# Patient Record
Sex: Male | Born: 1940 | Race: White | Hispanic: No | Marital: Married | Smoking: Never smoker
Health system: Southern US, Community
[De-identification: ages and names within clinical notes are randomized; demographics above are authoritative.]

## PROBLEM LIST (undated history)

## (undated) DIAGNOSIS — T8859XA Other complications of anesthesia, initial encounter: Secondary | ICD-10-CM

## (undated) DIAGNOSIS — I10 Essential (primary) hypertension: Secondary | ICD-10-CM

## (undated) DIAGNOSIS — E7800 Pure hypercholesterolemia, unspecified: Secondary | ICD-10-CM

## (undated) DIAGNOSIS — Z8619 Personal history of other infectious and parasitic diseases: Secondary | ICD-10-CM

## (undated) DIAGNOSIS — Z952 Presence of prosthetic heart valve: Secondary | ICD-10-CM

## (undated) DIAGNOSIS — T4145XA Adverse effect of unspecified anesthetic, initial encounter: Secondary | ICD-10-CM

## (undated) DIAGNOSIS — Z87898 Personal history of other specified conditions: Secondary | ICD-10-CM

## (undated) DIAGNOSIS — I350 Nonrheumatic aortic (valve) stenosis: Secondary | ICD-10-CM

## (undated) DIAGNOSIS — I2510 Atherosclerotic heart disease of native coronary artery without angina pectoris: Secondary | ICD-10-CM

## (undated) DIAGNOSIS — I739 Peripheral vascular disease, unspecified: Secondary | ICD-10-CM

## (undated) DIAGNOSIS — R011 Cardiac murmur, unspecified: Secondary | ICD-10-CM

## (undated) DIAGNOSIS — I359 Nonrheumatic aortic valve disorder, unspecified: Secondary | ICD-10-CM

## (undated) DIAGNOSIS — R259 Unspecified abnormal involuntary movements: Secondary | ICD-10-CM

## (undated) DIAGNOSIS — B009 Herpesviral infection, unspecified: Secondary | ICD-10-CM

## (undated) DIAGNOSIS — L570 Actinic keratosis: Secondary | ICD-10-CM

## (undated) DIAGNOSIS — R7309 Other abnormal glucose: Secondary | ICD-10-CM

## (undated) DIAGNOSIS — K439 Ventral hernia without obstruction or gangrene: Secondary | ICD-10-CM

## (undated) HISTORY — DX: Peripheral vascular disease, unspecified: I73.9

## (undated) HISTORY — DX: Other abnormal glucose: R73.09

## (undated) HISTORY — DX: Nonrheumatic aortic (valve) stenosis: I35.0

## (undated) HISTORY — DX: Ventral hernia without obstruction or gangrene: K43.9

## (undated) HISTORY — DX: Unspecified abnormal involuntary movements: R25.9

## (undated) HISTORY — DX: Personal history of other infectious and parasitic diseases: Z86.19

## (undated) HISTORY — DX: Presence of prosthetic heart valve: Z95.2

## (undated) HISTORY — PX: OTHER SURGICAL HISTORY: SHX169

## (undated) HISTORY — DX: Actinic keratosis: L57.0

## (undated) HISTORY — DX: Herpesviral infection, unspecified: B00.9

## (undated) HISTORY — DX: Cardiac murmur, unspecified: R01.1

## (undated) HISTORY — DX: Essential (primary) hypertension: I10

## (undated) HISTORY — PX: VASECTOMY: SHX75

## (undated) HISTORY — PX: CORONARY ARTERY BYPASS GRAFT: SHX141

## (undated) HISTORY — DX: Personal history of other specified conditions: Z87.898

## (undated) HISTORY — PX: US ECHOCARDIOGRAPHY: HXRAD669

## (undated) HISTORY — DX: Pure hypercholesterolemia, unspecified: E78.00

## (undated) HISTORY — DX: Nonrheumatic aortic valve disorder, unspecified: I35.9

## (undated) HISTORY — DX: Atherosclerotic heart disease of native coronary artery without angina pectoris: I25.10

## (undated) HISTORY — PX: CARDIAC VALVE REPLACEMENT: SHX585

---

## 1995-05-02 ENCOUNTER — Encounter: Payer: Self-pay | Admitting: Family Medicine

## 1995-05-02 LAB — CONVERTED CEMR LAB: PSA: 0.8 ng/mL

## 1999-04-10 ENCOUNTER — Encounter: Payer: Self-pay | Admitting: Family Medicine

## 1999-04-10 LAB — CONVERTED CEMR LAB: PSA: 0.7 ng/mL

## 2000-07-20 ENCOUNTER — Encounter: Payer: Self-pay | Admitting: Family Medicine

## 2000-07-20 LAB — CONVERTED CEMR LAB
Blood Glucose, Fasting: 107 mg/dL
PSA: 0.5 ng/mL
TSH: 1.7 microintl units/mL

## 2001-12-17 ENCOUNTER — Encounter: Payer: Self-pay | Admitting: Family Medicine

## 2001-12-25 HISTORY — PX: US ECHOCARDIOGRAPHY: HXRAD669

## 2004-04-01 ENCOUNTER — Ambulatory Visit: Payer: Self-pay | Admitting: Family Medicine

## 2004-04-02 ENCOUNTER — Encounter: Payer: Self-pay | Admitting: Family Medicine

## 2004-05-03 ENCOUNTER — Ambulatory Visit: Payer: Self-pay | Admitting: Family Medicine

## 2005-02-10 ENCOUNTER — Ambulatory Visit: Payer: Self-pay | Admitting: Family Medicine

## 2005-05-19 ENCOUNTER — Ambulatory Visit: Payer: Self-pay | Admitting: Family Medicine

## 2005-06-02 ENCOUNTER — Ambulatory Visit: Payer: Self-pay | Admitting: Family Medicine

## 2005-09-26 ENCOUNTER — Ambulatory Visit: Payer: Self-pay | Admitting: Family Medicine

## 2006-01-19 ENCOUNTER — Ambulatory Visit: Payer: Self-pay | Admitting: Family Medicine

## 2006-10-06 ENCOUNTER — Telehealth (INDEPENDENT_AMBULATORY_CARE_PROVIDER_SITE_OTHER): Payer: Self-pay | Admitting: *Deleted

## 2006-10-06 ENCOUNTER — Ambulatory Visit: Payer: Self-pay | Admitting: Family Medicine

## 2006-10-06 DIAGNOSIS — L259 Unspecified contact dermatitis, unspecified cause: Secondary | ICD-10-CM | POA: Insufficient documentation

## 2007-01-30 ENCOUNTER — Ambulatory Visit: Payer: Self-pay | Admitting: Family Medicine

## 2007-01-30 DIAGNOSIS — T50995A Adverse effect of other drugs, medicaments and biological substances, initial encounter: Secondary | ICD-10-CM | POA: Insufficient documentation

## 2007-06-29 ENCOUNTER — Ambulatory Visit: Payer: Self-pay | Admitting: Family Medicine

## 2007-07-18 ENCOUNTER — Encounter: Payer: Self-pay | Admitting: Family Medicine

## 2007-07-18 DIAGNOSIS — E7800 Pure hypercholesterolemia, unspecified: Secondary | ICD-10-CM

## 2007-07-18 DIAGNOSIS — Z87898 Personal history of other specified conditions: Secondary | ICD-10-CM

## 2007-07-18 DIAGNOSIS — I10 Essential (primary) hypertension: Secondary | ICD-10-CM | POA: Insufficient documentation

## 2007-07-18 HISTORY — DX: Personal history of other specified conditions: Z87.898

## 2007-07-18 HISTORY — DX: Pure hypercholesterolemia, unspecified: E78.00

## 2007-07-18 HISTORY — DX: Essential (primary) hypertension: I10

## 2007-10-26 ENCOUNTER — Ambulatory Visit: Payer: Self-pay | Admitting: Family Medicine

## 2007-10-26 DIAGNOSIS — L570 Actinic keratosis: Secondary | ICD-10-CM

## 2007-10-26 DIAGNOSIS — R011 Cardiac murmur, unspecified: Secondary | ICD-10-CM

## 2007-10-26 DIAGNOSIS — R259 Unspecified abnormal involuntary movements: Secondary | ICD-10-CM

## 2007-10-26 HISTORY — DX: Actinic keratosis: L57.0

## 2007-10-26 HISTORY — DX: Unspecified abnormal involuntary movements: R25.9

## 2007-10-26 HISTORY — DX: Cardiac murmur, unspecified: R01.1

## 2007-12-13 ENCOUNTER — Encounter: Payer: Self-pay | Admitting: Family Medicine

## 2007-12-17 ENCOUNTER — Ambulatory Visit: Payer: Self-pay | Admitting: Family Medicine

## 2008-01-01 ENCOUNTER — Encounter (INDEPENDENT_AMBULATORY_CARE_PROVIDER_SITE_OTHER): Payer: Self-pay | Admitting: Neurology

## 2008-01-01 ENCOUNTER — Ambulatory Visit: Payer: Self-pay

## 2008-01-02 ENCOUNTER — Encounter: Payer: Self-pay | Admitting: Cardiology

## 2008-01-17 ENCOUNTER — Ambulatory Visit: Payer: Self-pay | Admitting: Family Medicine

## 2008-01-17 DIAGNOSIS — B029 Zoster without complications: Secondary | ICD-10-CM | POA: Insufficient documentation

## 2008-02-04 ENCOUNTER — Ambulatory Visit: Payer: Self-pay | Admitting: Family Medicine

## 2008-02-04 DIAGNOSIS — G4700 Insomnia, unspecified: Secondary | ICD-10-CM | POA: Insufficient documentation

## 2008-02-05 ENCOUNTER — Ambulatory Visit: Payer: Self-pay | Admitting: Cardiology

## 2008-02-19 ENCOUNTER — Encounter: Payer: Self-pay | Admitting: Family Medicine

## 2008-02-19 ENCOUNTER — Ambulatory Visit: Payer: Self-pay | Admitting: Cardiology

## 2008-02-19 LAB — CONVERTED CEMR LAB
BUN: 16 mg/dL (ref 6–23)
Bilirubin, Direct: 0.1 mg/dL (ref 0.0–0.3)
Chloride: 106 meq/L (ref 96–112)
Glucose, Bld: 102 mg/dL — ABNORMAL HIGH (ref 70–99)
Indirect Bilirubin: 0.6 mg/dL (ref 0.0–0.9)
LDL Cholesterol: 135 mg/dL — ABNORMAL HIGH (ref 0–99)
Potassium: 4.3 meq/L (ref 3.5–5.3)
Total CHOL/HDL Ratio: 5.7
Total Protein: 6.4 g/dL (ref 6.0–8.3)
VLDL: 52 mg/dL — ABNORMAL HIGH (ref 0–40)

## 2008-03-05 ENCOUNTER — Encounter: Payer: Self-pay | Admitting: Family Medicine

## 2008-03-05 ENCOUNTER — Ambulatory Visit: Payer: Self-pay

## 2008-03-06 LAB — CONVERTED CEMR LAB
BUN: 16 mg/dL (ref 6–23)
Chloride: 106 meq/L (ref 96–112)
Glucose, Bld: 121 mg/dL — ABNORMAL HIGH (ref 70–99)
Potassium: 4.2 meq/L (ref 3.5–5.3)
Sodium: 141 meq/L (ref 135–145)

## 2008-03-17 ENCOUNTER — Ambulatory Visit: Payer: Self-pay | Admitting: Family Medicine

## 2008-03-17 DIAGNOSIS — R7309 Other abnormal glucose: Secondary | ICD-10-CM

## 2008-03-17 HISTORY — DX: Other abnormal glucose: R73.09

## 2008-03-17 LAB — CONVERTED CEMR LAB
ALT: 20 units/L (ref 0–53)
BUN: 16 mg/dL (ref 6–23)
CO2: 29 meq/L (ref 19–32)
GFR calc Af Amer: 60 mL/min
Glucose, Bld: 111 mg/dL — ABNORMAL HIGH (ref 70–99)
Potassium: 4.8 meq/L (ref 3.5–5.1)

## 2008-03-28 ENCOUNTER — Emergency Department (HOSPITAL_COMMUNITY): Admission: AC | Admit: 2008-03-28 | Discharge: 2008-03-29 | Payer: Self-pay | Admitting: Emergency Medicine

## 2008-03-28 ENCOUNTER — Ambulatory Visit: Payer: Self-pay | Admitting: Internal Medicine

## 2008-04-08 ENCOUNTER — Ambulatory Visit (HOSPITAL_COMMUNITY): Admission: RE | Admit: 2008-04-08 | Discharge: 2008-04-08 | Payer: Self-pay | Admitting: Orthopaedic Surgery

## 2008-04-30 ENCOUNTER — Ambulatory Visit: Payer: Self-pay | Admitting: Family Medicine

## 2008-04-30 LAB — CONVERTED CEMR LAB
AST: 23 units/L (ref 0–37)
Cholesterol: 118 mg/dL (ref 0–200)
HDL: 27.2 mg/dL — ABNORMAL LOW (ref >39.0)
LDL Cholesterol: 77 mg/dL (ref 0–99)
VLDL: 14 mg/dL (ref 0–40)

## 2008-05-07 ENCOUNTER — Ambulatory Visit: Payer: Self-pay | Admitting: Family Medicine

## 2008-07-25 ENCOUNTER — Ambulatory Visit (HOSPITAL_COMMUNITY): Admission: RE | Admit: 2008-07-25 | Discharge: 2008-07-25 | Payer: Self-pay | Admitting: Orthopaedic Surgery

## 2008-08-12 ENCOUNTER — Encounter: Payer: Self-pay | Admitting: Cardiology

## 2008-08-14 ENCOUNTER — Telehealth: Payer: Self-pay | Admitting: Family Medicine

## 2008-10-07 ENCOUNTER — Ambulatory Visit: Payer: Self-pay

## 2008-10-07 ENCOUNTER — Encounter: Payer: Self-pay | Admitting: Cardiology

## 2008-10-09 ENCOUNTER — Ambulatory Visit: Payer: Self-pay | Admitting: Cardiology

## 2008-10-14 ENCOUNTER — Encounter: Payer: Self-pay | Admitting: Cardiology

## 2008-10-22 ENCOUNTER — Ambulatory Visit: Payer: Self-pay | Admitting: Cardiovascular Disease

## 2008-10-22 ENCOUNTER — Encounter: Payer: Self-pay | Admitting: Family Medicine

## 2008-10-22 ENCOUNTER — Encounter (INDEPENDENT_AMBULATORY_CARE_PROVIDER_SITE_OTHER): Payer: Self-pay | Admitting: *Deleted

## 2008-10-24 LAB — CONVERTED CEMR LAB
Albumin: 4.3 g/dL (ref 3.5–5.2)
BUN: 21 mg/dL (ref 6–23)
CO2: 21 meq/L (ref 19–32)
Calcium: 9.3 mg/dL (ref 8.4–10.5)
Chloride: 108 meq/L (ref 96–112)
Cholesterol: 155 mg/dL (ref 0–200)
Creatinine, Ser: 1.27 mg/dL (ref 0.40–1.50)
Glucose, Bld: 70 mg/dL (ref 70–99)
HDL: 48 mg/dL (ref >39)
Potassium: 4.8 meq/L (ref 3.5–5.3)
Total CHOL/HDL Ratio: 3.2
Triglycerides: 92 mg/dL (ref <150)

## 2008-12-04 ENCOUNTER — Ambulatory Visit: Payer: Self-pay | Admitting: Family Medicine

## 2008-12-04 DIAGNOSIS — I739 Peripheral vascular disease, unspecified: Secondary | ICD-10-CM

## 2008-12-04 HISTORY — DX: Peripheral vascular disease, unspecified: I73.9

## 2008-12-11 ENCOUNTER — Telehealth (INDEPENDENT_AMBULATORY_CARE_PROVIDER_SITE_OTHER): Payer: Self-pay | Admitting: Internal Medicine

## 2008-12-26 ENCOUNTER — Ambulatory Visit: Payer: Self-pay | Admitting: Family Medicine

## 2009-03-17 ENCOUNTER — Encounter: Payer: Self-pay | Admitting: Cardiology

## 2009-03-17 ENCOUNTER — Ambulatory Visit: Payer: Self-pay

## 2009-03-19 ENCOUNTER — Ambulatory Visit: Payer: Self-pay | Admitting: Cardiology

## 2009-04-09 ENCOUNTER — Ambulatory Visit: Payer: Self-pay | Admitting: Cardiology

## 2009-04-10 LAB — CONVERTED CEMR LAB
Alkaline Phosphatase: 62 units/L (ref 39–117)
BUN: 17 mg/dL (ref 6–23)
CO2: 28 meq/L (ref 19–32)
Cholesterol: 164 mg/dL (ref 0–200)
Glucose, Bld: 101 mg/dL — ABNORMAL HIGH (ref 70–99)
HDL: 39 mg/dL — ABNORMAL LOW (ref >39)
LDL Cholesterol: 100 mg/dL — ABNORMAL HIGH (ref 0–99)
Sodium: 140 meq/L (ref 135–145)
Total Bilirubin: 1 mg/dL (ref 0.3–1.2)
Total Protein: 6.5 g/dL (ref 6.0–8.3)
Triglycerides: 127 mg/dL (ref <150)
VLDL: 25 mg/dL (ref 0–40)

## 2009-05-28 ENCOUNTER — Telehealth: Payer: Self-pay | Admitting: Family Medicine

## 2009-09-18 ENCOUNTER — Ambulatory Visit: Payer: Self-pay | Admitting: Family Medicine

## 2009-10-19 ENCOUNTER — Encounter (INDEPENDENT_AMBULATORY_CARE_PROVIDER_SITE_OTHER): Payer: Self-pay | Admitting: *Deleted

## 2009-10-21 ENCOUNTER — Telehealth (INDEPENDENT_AMBULATORY_CARE_PROVIDER_SITE_OTHER): Payer: Self-pay | Admitting: *Deleted

## 2009-10-22 ENCOUNTER — Ambulatory Visit: Payer: Self-pay | Admitting: Family Medicine

## 2009-10-22 LAB — CONVERTED CEMR LAB
ALT: 20 units/L (ref 0–53)
BUN: 18 mg/dL (ref 6–23)
CO2: 28 meq/L (ref 19–32)
Calcium: 9 mg/dL (ref 8.4–10.5)
Chloride: 108 meq/L (ref 96–112)
Cholesterol: 161 mg/dL (ref 0–200)
Creatinine, Ser: 1.3 mg/dL (ref 0.4–1.5)
HDL: 38.8 mg/dL — ABNORMAL LOW (ref >39.00)
Total Bilirubin: 1.2 mg/dL (ref 0.3–1.2)
Total Protein: 6 g/dL (ref 6.0–8.3)
Triglycerides: 116 mg/dL (ref 0.0–149.0)

## 2009-10-29 ENCOUNTER — Ambulatory Visit: Payer: Self-pay | Admitting: Family Medicine

## 2009-10-29 ENCOUNTER — Encounter (INDEPENDENT_AMBULATORY_CARE_PROVIDER_SITE_OTHER): Payer: Self-pay | Admitting: *Deleted

## 2009-12-07 ENCOUNTER — Encounter (INDEPENDENT_AMBULATORY_CARE_PROVIDER_SITE_OTHER): Payer: Self-pay | Admitting: *Deleted

## 2009-12-10 ENCOUNTER — Ambulatory Visit: Payer: Self-pay | Admitting: Gastroenterology

## 2009-12-15 ENCOUNTER — Ambulatory Visit: Payer: Self-pay | Admitting: Family Medicine

## 2009-12-22 ENCOUNTER — Ambulatory Visit: Payer: Self-pay | Admitting: Gastroenterology

## 2010-02-18 ENCOUNTER — Telehealth: Payer: Self-pay | Admitting: Cardiology

## 2010-04-08 ENCOUNTER — Other Ambulatory Visit: Payer: Self-pay | Admitting: Cardiology

## 2010-04-08 ENCOUNTER — Ambulatory Visit: Admission: RE | Admit: 2010-04-08 | Discharge: 2010-04-08 | Payer: Self-pay | Source: Home / Self Care

## 2010-04-09 ENCOUNTER — Encounter: Payer: Self-pay | Admitting: Cardiology

## 2010-04-09 ENCOUNTER — Ambulatory Visit
Admission: RE | Admit: 2010-04-09 | Discharge: 2010-04-09 | Payer: Self-pay | Source: Home / Self Care | Attending: Cardiology | Admitting: Cardiology
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
## 2010-04-13 NOTE — Progress Notes (Signed)
----   Converted from flag ---- ---- 10/21/2009 1:07 PM, Crawford Givens MD wrote: v13.8 PSA cmet/lipid 401.1  ---- 10/21/2009 12:52 PM, Liane Comber CMA (AAMA) wrote: Pt is scheduled for cpx labs tomorrow, what labs to draw and dx codes? Thanks Tasha ------------------------------
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
## 2010-04-13 NOTE — Miscellaneous (Signed)
Summary: [facility]  Clinical Lists Changes  Medications: Added new medication of MOVIPREP 100 GM  SOLR (PEG-KCL-NACL-NASULF-NA ASC-C) As per prep instructions. - Signed Rx of MOVIPREP 100 GM  SOLR (PEG-KCL-NACL-NASULF-NA ASC-C) As per prep instructions.;  #1 x 0;  Signed;  Entered by: Ezra Sites RN;  Authorized by: Meryl Dare MD [facility];  Method used: [facility] [place], [place]  [address], Ph: [phone] or [phone], Fax: [phone] Observations: Added new observation of ALLERGY REV: Done (12/10/2009 8:41)    Prescriptions: MOVIPREP 100 GM  SOLR (PEG-KCL-NACL-NASULF-NA ASC-C) As per prep instructions.  #1 x 0   Entered by:   Ezra Sites RN   Authorized by:   Meryl Dare MD [facility]   Signed by:   Ezra Sites RN on 12/10/2009   Method used:   [facility]ronically [facility]        [facility] (retail)       [address]       [place], [place]  [address]       Ph: [phone] or [phone]       Fax: [phone]   RxID:   [id number]

## 2010-04-13 NOTE — Progress Notes (Signed)
Summary: refill request for alprazolam  Phone Note Refill Request Message from:  Fax from Pharmacy  Refills Requested: Medication #1:  ALPRAZOLAM 0.25 MG TABS one tab ta night as needed insomnia.   Last Refilled: 09/03/2008 [facility] [address], [phone].  Initial call taken by: Lowella Petties CMA,  May 28, 2009 9:58 AM  Follow-up for Phone Call        [facility], [facility]ist will advise pt that he needs office visit before further refills. Follow-up by: Lowella Petties CMA,  May 28, 2009 11:43 AM    Prescriptions: ALPRAZOLAM 0.25 MG TABS (ALPRAZOLAM) one tab ta night as needed insomnia.  #30 x 0   Entered and Authorized by:   Shaune Leeks MD   Signed by:   Shaune Leeks MD on 05/28/2009   Method used:   Telephoned to ...       [facility] ([facility]ail)       [address]       [place], [place]  [address]       Ph: [phone] or [phone]       Fax: [phone]   RxID:   [id number]  Needs to be seen for any further refills. I haven't seen himn for quite some time. Shaune Leeks MD  May 28, 2009 11:39 AM

## 2010-04-13 NOTE — Progress Notes (Signed)
   Phone Note Call from Patient   Caller: Patient Summary of Call: Pt called requesting Lipitor refill. He is currently taking Lipitor 40mg  and taking 1/2 tablet once daily and wants new rx for 20mg  once daily. [facility]. In EMR documentation of Dr. Para March sent rx for Lipitor 20mg  once daily #90 with 3 refills. Called pharmacy spoke with pharmacist, she states rx was never received, gave verbal order. Pt aware. Initial call taken by: Lanny Hurst RN,  February 18, 2010 2:43 PM

## 2010-04-13 NOTE — Letter (Signed)
Summary: Moviprep Instructions  [facility]  [address]   [place], [place] [address]   Phone: [phone]  Fax: [phone]       [name]    09-Jan-1941    MRN: [id number]        Procedure Day Dorna Bloom: Tuesday, [birth year]     Arrival Time: 12:30 p.m.     Procedure Time: 1:30 p.m.     Location of Procedure:                    x   [facility] (4th Floor)                        PREPARATION FOR COLONOSCOPY WITH MOVIPREP   Starting 5 days prior to your procedure 12-17-09  do not eat nuts, seeds, popcorn, corn, beans, peas,  salads, or any raw vegetables.  Do not take any fiber supplements (e.g. Metamucil, Citrucel, and Benefiber).  THE DAY BEFORE YOUR PROCEDURE         DATE:  12-21-09   DAY: Monday  1.  Drink clear liquids the entire day-NO SOLID FOOD  2.  Do not drink anything colored red or purple.  Avoid juices with pulp.  No orange juice.  3.  Drink at least 64 oz. (8 glasses) of fluid/clear liquids during the day to prevent dehydration and help the prep work efficiently.  CLEAR LIQUIDS INCLUDE: Water Jello Ice Popsicles Tea (sugar ok, no milk/cream) Powdered fruit flavored drinks Coffee (sugar ok, no milk/cream) Gatorade Juice: apple, white grape, white cranberry  Lemonade Clear bullion, consomm, broth Carbonated beverages (any kind) Strained chicken noodle soup Hard Candy                             4.  In the morning, mix first dose of MoviPrep solution:    Empty 1 Pouch A and 1 Pouch B into the disposable container    Add lukewarm drinking water to the top line of the container. Mix to dissolve    Refrigerate (mixed solution should be used within 24 hrs)  5.  Begin drinking the prep at 5:00 p.m. The MoviPrep container is divided by 4 marks.   Every 15 minutes drink the solution down to the next mark (approximately 8 oz) until the full liter is complete.   6.  Follow completed prep with 16 oz of clear liquid of your choice  (Nothing red or purple).  Continue to drink clear liquids until bedtime.  7.  Before going to bed, mix second dose of MoviPrep solution:    Empty 1 Pouch A and 1 Pouch B into the disposable container    Add lukewarm drinking water to the top line of the container. Mix to dissolve    Refrigerate  THE DAY OF YOUR PROCEDURE      DATE: 12-22-09  DAY: Tuesday  Beginning at  8:30 a.m. (5 hours before procedure):         1. Every 15 minutes, drink the solution down to the next mark (approx 8 oz) until the full liter is complete.  2. Follow completed prep with 16 oz. of clear liquid of your choice.    3. You may drink clear liquids until 11:30 a.m.  (2 HOURS BEFORE PROCEDURE).   MEDICATION INSTRUCTIONS  Unless otherwise instructed, you should take regular prescription medications with a small sip of water   as  early as possible the morning of your procedure.           OTHER INSTRUCTIONS  You will need a responsible adult at least 69 years of age to accompany you and drive you home.   This person must remain in the waiting room during your procedure.  Wear loose fitting clothing that is easily removed.  Leave jewelry and other valuables at home.  However, you may wish to bring a book to read or  an iPod/MP3 player to listen to music as you wait for your procedure to start.  Remove all body piercing jewelry and leave at home.  Total time from sign-in until discharge is approximately 2-3 hours.  You should go home directly after your procedure and rest.  You can resume normal activities the  day after your procedure.  The day of your procedure you should not:   Drive   Make legal decisions   Operate machinery   Drink alcohol   Return to work  You will receive specific instructions about eating, activities and medications before you leave.    The above instructions have been reviewed and explained to me by   Ezra Sites RN  December 10, 2009 9:26 AM     I  fully understand and can verbalize these instructions _____________________________ Date _________

## 2010-04-13 NOTE — Letter (Signed)
Summary: Nadara Eaton letter  [place] at [facility]  [address] [place], [place] [address]   Phone: [phone]  Fax: [phone]       10/19/2009 MRN: [id number]  [facility] [address] [place], [place]  [address]  Dear Mr. [facility] [place], and [place] announce the retirement of Arta Silence, M.D., from full-time practice at the [facility] office effective September 10, 2009 and his plans of returning part-time.  It is important to Dr. Hetty Ely and to our practice that you understand that [facility] has seven physicians in our office for your health care needs.  We will continue to offer the same exceptional care that you have today.    Dr. Hetty Ely has spoken to many of you about his plans for retirement and returning part-time in the fall.   We will continue to work with you through the transition to schedule appointments for you in the office and meet the high standards that [facility] is committed to.   Again, it is with great pleasure that we share the news that Dr. Hetty Ely will return to [facility] in October of 2011 with a reduced schedule.    If you have any questions, or would like to request an appointment with one of our physicians, please call us at [phone] and press the option for Scheduling an appointment.  We take pleasure in providing you with excellent patient care and look forward to seeing you at your next office visit.  Our [facility] Physicians are:  Tillman Abide, M.D. Laurita Quint, M.D. Roxy Manns, M.D. Kerby Nora, M.D. Hannah Beat, M.D. Ruthe Mannan, M.D. We proudly welcomed Raechel Ache, M.D. and Eustaquio Boyden, M.D. to the practice in July/August 2011.  Sincerely,  [facility]

## 2010-04-13 NOTE — Letter (Signed)
Summary: Previsit letter  [facility]  [address] [place], [place] [address]   Phone: [phone]  Fax: [phone]       10/29/2009 MRN: [id number]  [facility] [address] [place], [place]  [address]  Dear [name],  Welcome to the Gastroenterology Division at [facility].    You are scheduled to see a nurse for your pre-procedure visit on 12/10/2009 at 9:00AM on the 3rd floor at [facility], [address]  We ask that you try to arrive at our office 15 minutes prior to your appointment time to allow for check-in.  Your nurse visit will consist of discussing your medical and surgical history, your immediate family medical history, and your medications.    Please bring a complete list of all your medications or, if you prefer, bring the medication bottles and we will list them.  We will need to be aware of both prescribed and over the counter drugs.  We will need to know exact dosage information as well.  If you are on blood thinners (Coumadin, Plavix, Aggrenox, Ticlid, etc.) please call our office today/prior to your appointment, as we need to consult with your physician about holding your medication.   Please be prepared to read and sign documents such as consent forms, a financial agreement, and acknowledgement forms.  If necessary, and with your consent, a friend or relative is welcome to sit-in on the nurse visit with you.  Please bring your insurance card so that we may make a copy of it.  If your insurance requires a referral to see a specialist, please bring your referral form from your primary care physician.  No co-pay is required for this nurse visit.     If you cannot keep your appointment, please call [phone] to cancel or reschedule prior to your appointment date.  This allows [place] the opportunity to schedule an appointment for another patient in need of care.    Thank you for choosing [facility] for your medical  needs.  We appreciate the opportunity to care for you.  Please visit [place] at our website  to learn more about our practice.                     Sincerely.                                                                                                                   The Gastroenterology Division
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
## 2010-04-13 NOTE — Procedures (Signed)
Summary: Colonoscopy  Patient: [name] Note: All result statuses are Final unless otherwise noted.  Tests: (1) Colonoscopy (COL)   COL Colonoscopy           DONE     [facility]     [address]     [place], [place]  [address]           COLONOSCOPY PROCEDURE REPORT     PATIENT:  [name], [name]  MR#:  [id number]     BIRTHDATE:  [birth year], 69 yrs. old  GENDER:  male     ENDOSCOPIST:  Judie Petit T. Russella Dar, MD, [facility]     Referred by:  Crawford Givens, M.D.     PROCEDURE DATE:  12/22/2009     PROCEDURE:  Colonoscopy [address]     ASA CLASS:  Class II     INDICATIONS:  1) Routine Risk Screening     MEDICATIONS:   Fentanyl 50 mcg IV, Versed 6 mg IV     DESCRIPTION OF PROCEDURE:   After the risks benefits and     alternatives of the procedure were thoroughly explained, informed     consent was obtained.  Digital rectal exam was performed and     revealed no abnormalities.   The LB [id number] endoscope     was introduced through the anus and advanced to the cecum, which     was identified by both the appendix and ileocecal valve, without     limitations.  The quality of the prep was excellent, using     MoviPrep.  The instrument was then slowly withdrawn as the colon     was fully examined.     <<PROCEDUREIMAGES>>     FINDINGS:  Mild diverticulosis was found in the sigmoid colon.  A     normal appearing cecum, ileocecal valve, and appendiceal orifice     were identified. The ascending, hepatic flexure, transverse,     splenic flexure, descending colon, and rectum appeared     unremarkable. Retroflexed views in the rectum revealed no     abnormalities. The time to cecum =  2  minutes. The scope was then     withdrawn (time =  8.5  min) from the patient and the procedure     completed.     COMPLICATIONS:  None           ENDOSCOPIC IMPRESSION:     1) Mild diverticulosis in the sigmoid colon           RECOMMENDATIONS:     1) High fiber diet with liberal fluid intake.   2) Continue current colorectal screening for "routine risk"     patients with a repeat colonoscopy in 10 years.           Venita Lick. Russella Dar, MD, [facility]           n.     eSIGNED:   Venita Lick. Stark at 12/22/2009 02:02 PM           [place], [place], [phone]  Note: An exclamation mark (!) indicates a result that was not dispersed into the flowsheet. Document Creation Date: 12/22/2009 2:03 PM _______________________________________________________________________  (1) Order result status: Final Collection or observation date-time: 12/22/2009 13:57 Requested date-time:  Receipt date-time:  Reported date-time:  Referring Physician:   Ordering Physician: Claudette Head [phone]) Specimen Source:  Source: Launa Grill Order Number: [id number] Lab site:   Appended Document: Colonoscopy    Clinical Lists Changes  Observations: Added  new observation of COLONNXTDUE: 12/2019 (12/22/2009 14:05)

## 2010-04-13 NOTE — Assessment & Plan Note (Signed)
Summary: FLU SHOT/CLE   Nurse Visit   Allergies: 1)  ! Paxil  Orders Added: 1)  Flu Vaccine 54yrs + MEDICARE PATIENTS [Q2039] 2)  Administration Flu vaccine - MCR [G0008]  Flu Vaccine Consent Questions     Do you have a history of severe allergic reactions to this vaccine? no    Any prior history of allergic reactions to egg and/or gelatin? no    Do you have a sensitivity to the preservative Thimersol? no    Do you have a past history of Guillan-Barre Syndrome? no    Do you currently have an acute febrile illness? no    Have you ever had a severe reaction to latex? no    Vaccine information given and explained to patient? yes    Are you currently pregnant? no    Lot Number:[id number]   Exp Date:09/11/2010   Site Given  Left Deltoid IMu

## 2010-04-14 ENCOUNTER — Telehealth: Payer: Self-pay | Admitting: Family Medicine

## 2010-04-15 ENCOUNTER — Telehealth: Payer: Self-pay | Admitting: Cardiology
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
## 2010-04-21 NOTE — Progress Notes (Signed)
Summary: refill request for xanax  Phone Note Refill Request Message from:  Fax from Pharmacy  Refills Requested: Medication #1:  ALPRAZOLAM 0.25 MG TABS one tab ta night as needed insomnia.   Last Refilled: 05/28/2009 [facility] [address]  [phone]  Initial call taken by: Lowella Petties CMA, AAMA,  April 14, 2010 10:34 AM  Follow-up for Phone Call        please call in.  Follow-up by: Crawford Givens MD,  April 14, 2010 12:22 PM  Additional Follow-up for Phone Call Additional follow up Details #1::        Medication phoned to pharmacy.  Additional Follow-up by: Delilah Shan CMA (AAMA),  April 14, 2010 5:14 PM    Prescriptions: ALPRAZOLAM 0.25 MG TABS (ALPRAZOLAM) one tab ta night as needed insomnia.  #30 x 1   Entered and Authorized by:   Crawford Givens MD   Signed by:   Crawford Givens MD on 04/14/2010   Method used:   [facility] ...       [facility]  [address]ail[facility]       [address]       [place], [place]  [address]       Ph: [phone] or [phone]       Fax: [phone]   RxID:   [id number]

## 2010-04-21 NOTE — Progress Notes (Signed)
   Phone Note Call from Patient   Caller: Patient Summary of Call: pt rtn call from this am-pls call [phone] Initial call taken by: Glynda Jaeger,  April 15, 2010 8:34 AM     Appended Document:  Discussed echo with patient.  Looks like severe aortic stenosis.  He is doing fairly well symptomatically, with some dyspnea when he jogs.  I am going to have him do a carefully-supervised ETT to assess his actual exercise tolerance and look at blood presssure response.   Appended Document: order GXT discussed with pt and given GXT instructions-   Clinical Lists Changes  Orders: Added new Referral order of Treadmill (Treadmill) - Signed

## 2010-04-22 ENCOUNTER — Other Ambulatory Visit: Payer: Self-pay | Admitting: Cardiology

## 2010-04-22 ENCOUNTER — Encounter (INDEPENDENT_AMBULATORY_CARE_PROVIDER_SITE_OTHER): Payer: Medicare Other | Admitting: Cardiology

## 2010-04-22 ENCOUNTER — Encounter: Payer: Self-pay | Admitting: Cardiology

## 2010-04-22 ENCOUNTER — Encounter (INDEPENDENT_AMBULATORY_CARE_PROVIDER_SITE_OTHER): Payer: Medicare Other

## 2010-04-22 DIAGNOSIS — R0609 Other forms of dyspnea: Secondary | ICD-10-CM

## 2010-04-22 DIAGNOSIS — R0989 Other specified symptoms and signs involving the circulatory and respiratory systems: Secondary | ICD-10-CM

## 2010-04-22 DIAGNOSIS — I359 Nonrheumatic aortic valve disorder, unspecified: Secondary | ICD-10-CM

## 2010-04-22 LAB — PROTIME-INR: Prothrombin Time: 11.3 s (ref 10.2–12.4)

## 2010-04-22 LAB — CBC WITH DIFFERENTIAL/PLATELET
Basophils Absolute: 0 10*3/uL (ref 0.0–0.1)
Eosinophils Absolute: 0.2 10*3/uL (ref 0.0–0.7)
HCT: 43.6 % (ref 39.0–52.0)
Lymphs Abs: 1.2 10*3/uL (ref 0.7–4.0)
MCHC: 34.8 g/dL (ref 30.0–36.0)
MCV: 91.9 fl (ref 78.0–100.0)
Monocytes Absolute: 0.4 10*3/uL (ref 0.1–1.0)
Neutrophils Relative %: 67.3 % (ref 43.0–77.0)
Platelets: 172 10*3/uL (ref 150.0–400.0)
RDW: 13.2 % (ref 11.5–14.6)

## 2010-04-22 LAB — BASIC METABOLIC PANEL
BUN: 16 mg/dL (ref 6–23)
Chloride: 104 mEq/L (ref 96–112)
Creatinine, Ser: 1.3 mg/dL (ref 0.4–1.5)
Glucose, Bld: 98 mg/dL (ref 70–99)
Potassium: 3.8 mEq/L (ref 3.5–5.1)

## 2010-04-23 ENCOUNTER — Telehealth: Payer: Self-pay | Admitting: Cardiology

## 2010-04-27 ENCOUNTER — Inpatient Hospital Stay (HOSPITAL_BASED_OUTPATIENT_CLINIC_OR_DEPARTMENT_OTHER)
Admission: RE | Admit: 2010-04-27 | Discharge: 2010-04-27 | Disposition: A | Discharge status: Home / Self Care | Payer: Medicare Other | Source: Ambulatory Visit | Attending: Cardiology | Admitting: Cardiology

## 2010-04-27 DIAGNOSIS — I10 Essential (primary) hypertension: Secondary | ICD-10-CM | POA: Insufficient documentation

## 2010-04-27 DIAGNOSIS — I359 Nonrheumatic aortic valve disorder, unspecified: Secondary | ICD-10-CM | POA: Insufficient documentation

## 2010-04-27 DIAGNOSIS — I2510 Atherosclerotic heart disease of native coronary artery without angina pectoris: Secondary | ICD-10-CM | POA: Insufficient documentation

## 2010-04-27 DIAGNOSIS — R9439 Abnormal result of other cardiovascular function study: Secondary | ICD-10-CM | POA: Insufficient documentation

## 2010-04-27 LAB — POCT I-STAT 3, VENOUS BLOOD GAS (G3P V)
O2 Saturation: 67 %
TCO2: 23 mmol/L (ref 0–100)
pCO2, Ven: 43.4 mmHg — ABNORMAL LOW (ref 45.0–50.0)

## 2010-04-27 LAB — POCT I-STAT 3, ART BLOOD GAS (G3+)
Acid-base deficit: 1 mmol/L (ref 0.0–2.0)
Bicarbonate: 23.2 mEq/L (ref 20.0–24.0)
O2 Saturation: 93 %
TCO2: 24 mmol/L (ref 0–100)

## 2010-04-29 ENCOUNTER — Encounter (INDEPENDENT_AMBULATORY_CARE_PROVIDER_SITE_OTHER): Payer: Self-pay | Admitting: Thoracic Surgery (Cardiothoracic Vascular Surgery)

## 2010-04-29 DIAGNOSIS — I359 Nonrheumatic aortic valve disorder, unspecified: Secondary | ICD-10-CM

## 2010-04-29 DIAGNOSIS — I2510 Atherosclerotic heart disease of native coronary artery without angina pectoris: Secondary | ICD-10-CM

## 2010-04-29 NOTE — Letter (Signed)
Summary: Cardiac Catheterization Instructions- [facility]  [facility], Main Office  [address]   [place], [place] [address]   Phone: [phone]  Fax: [phone]     04/22/2010 MRN: [id number]  [facility] [address] [place], [place]  [address]  [place]  Dear Mr. [name],   You are scheduled for a Cardiac Catheterization on Tuesday February 14,2012  with Dr. Marca Ancona.  Please arrive to the 1st floor of the Heart and Vascular Center at [facility] at 8 am  on the day of your procedure. Please do not arrive before 6:30 a.m. Call the Heart and Vascular Center at [phone] if you are unable to make your appointmnet. The Code to get into the parking garage under the building is 0300. Take the elevators to the 1st floor. You must have someone to drive you home. Someone must be with you for the first 24 hours after you arrive home. Please wear clothes that are easy to get on and off and wear slip-on shoes. Do not eat or drink after midnight except water with your medications that morning. Bring all your medications and current insurance cards with you.    _x__ You may take ALL of your medications with water that morning.   The usual length of stay after your procedure is 2 to 3 hours. This can vary.  If you have any questions, please call the office at the number listed above.   Katina Dung, RN, BSN

## 2010-04-30 NOTE — Consult Note (Signed)
NEW PATIENT CONSULTATION  [name], [name] E DOB:  1940-10-09                                        April 29, 2010 CHART #:  [id number]  REASON FOR CONSULTATION:  Severe aortic stenosis and two-vessel coronary disease.  HISTORY OF PRESENT ILLNESS:  The patient is a 70 year old gentleman who has been known for a couple of years to have a heart murmur and aortic stenosis, it previously had been moderate.  He has really been doing well overall from a symptomatic standpoint.  He does exercise on a regular basis and he has noted that recently while walking on a treadmill, he would walk for a mile; he would every quarter of a mile jog for a short period of time.  He noticed when he did that he would get tightness in his chest, it would resolve when he went back to walking.  He had a scheduled echocardiogram done which showed worsening of his aortic stenosis with his valve area having progressed to 0.85 sq. cm with a mean gradient of 42 mmHg and a peak gradient of 74 mmHg.  He also had some calcification of his mitral annulus, but no regurgitation. Left ventricular function was preserved.  He then underwent cardiac catheterization where he was found to have two-vessel disease.  There was an 80% mid circumflex stenosis and a 95% ostial stenosis in the first diagonal branch of the LAD.  His right heart catheterization showed a right atrial pressure of 7, RV 38/12, PA 35/14, wedge of 11, cardiac index was 2.3.  A left ventriculogram and pressure gradients were not performed.  The patient specifically denies any rest pain, congestive heart failure symptoms, or any prolonged episodes of chest tightness.  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  CURRENT MEDICATIONS: 1. Finasteride 5 mg daily. 2. Alprazolam 0.25 mg  nightly. 3. Lisinopril 20 mg daily. 4. Lipitor 40 mg daily. 5. Aspirin 81 mg daily. 6. Fish oil 1200 units daily. 7. Zovirax ointment 5% p.r.n. 8. Coreg 6.25 mg daily. 9. Multivitamin 1 daily.  ALLERGIES:  He has allergy to Paxil which causes sweating.  FAMILY HISTORY:  Mother did have heart disease, but in advanced age. Father did not have known heart disease.  SOCIAL HISTORY:  He is married.  He is a retired Insurance underwriter.  He does not smoke or drink.  He does exercise regularly, remains very active.  REVIEW OF SYSTEMS:  Chest tightness and heart murmur.  He has also had a cold with some nasal and lung congestion and cough, for which he has been taking Robitussin.  He says that he has had that for about a month now, it waxes and wanes, but is not completely resolved.  All other systems are negative.  PHYSICAL EXAMINATION:  General:  The patient is a well-appearing 70 year old gentleman, appears younger than his stated age.  Vital Signs:  Blood pressure is 130/80, pulse 70, respirations are 20, and oxygen saturation is 97% on room air.  Neurological:  He is alert and oriented x3 with no focal deficits.  HEENT:  Unremarkable.  Neck:  Supple without thyromegaly or adenopathy.  He does have transmitted murmur sounds bilaterally.  Cardiac:  Regular rate and rhythm with a crescendo- decrescendo high-pitched  3/6 systolic murmur heard throughout the precordium.  Lungs:  Clear with equal breath sounds.  Abdomen:  Soft and nontender.  Extremities:  Without clubbing, cyanosis, or edema.  He has 2+ pulses throughout.  LABORATORY DATA:  Cardiac catheterization and echocardiogram reviewed as where the reports findings as previously noted.  IMPRESSION:  The patient is a 70 year old gentleman with known aortic stenosis which has progressed now from moderate to severe, it is symptomatic, but only with exertion presently.  He does also have two- vessel coronary disease with  significant stenoses in the circumflex as well as relatively good sized first diagonal branch of the left anterior descending coronary artery.  Aortic valve replacement and coronary artery bypass grafting were indicated for survival benefit as well as relief of symptoms.  I discussed in detail with the patient the indications, risks, benefits, and alternatives.  He understands the risks of surgery include, but not limited to death, stroke, myocardial infarction, deep venous thrombosis, pulmonary embolism, possible need for transfusions, infections as well as other organ system dysfunction including respiratory, renal, hepatic, or gastrointestinal complications.  There is also potential for complete heart block requiring permanent pacemaker in 1-2% of patients after aortic valve replacement.  He understands and accepts these risks and agrees to proceed.  We also discussed valve selection and discussed the basic types of valves including mechanical and tissue valves and the relative advantages and disadvantages of each.  He does not want to be on Coumadin long-term and given that he is over 65, the longevity of pericardial valve should be excellent.  We will plan to treat him with Coumadin short-term until the valve is healed in, but he would not need to be on that for the rest of his life.  We have tentatively scheduled his surgery for Monday, May 10, 2010.  He would be admitted on the day of surgery.  He will need carotid duplex prior to surgery given there is some possibility to tell if he has bruits due to the transmitted heart murmur sounds.  Salvatore Decent [name], M.D. Electronically Signed  SCH/MEDQ  D:  04/29/2010  T:  04/30/2010  Job:  [id number]  cc:   Dwana Curd. Para March, M.D. Marca Ancona, MD

## 2010-05-05 ENCOUNTER — Encounter: Payer: Self-pay | Admitting: Cardiology

## 2010-05-05 NOTE — Progress Notes (Signed)
Summary: pt has appt with dr [name]   Phone Note From Other Clinic   Caller: dr hendrickson Summary of Call: pt has appt with dr [name] on 2-16 at 10a Initial call taken by: Glynda Jaeger,  April 23, 2010 12:10 PM     Appended Document: pt has appt with dr [name] pt aware of appt

## 2010-05-06 ENCOUNTER — Encounter (HOSPITAL_COMMUNITY)
Admission: RE | Admit: 2010-05-06 | Discharge: 2010-05-06 | Disposition: A | Discharge status: Home / Self Care | Payer: Medicare Other | Source: Ambulatory Visit | Attending: Thoracic Surgery (Cardiothoracic Vascular Surgery) | Admitting: Thoracic Surgery (Cardiothoracic Vascular Surgery)

## 2010-05-06 ENCOUNTER — Ambulatory Visit (HOSPITAL_COMMUNITY): Payer: Medicare Other

## 2010-05-06 ENCOUNTER — Other Ambulatory Visit: Payer: Self-pay | Admitting: Thoracic Surgery (Cardiothoracic Vascular Surgery)

## 2010-05-06 DIAGNOSIS — Z01810 Encounter for preprocedural cardiovascular examination: Secondary | ICD-10-CM

## 2010-05-06 DIAGNOSIS — Z01811 Encounter for preprocedural respiratory examination: Secondary | ICD-10-CM | POA: Insufficient documentation

## 2010-05-06 DIAGNOSIS — Z01818 Encounter for other preprocedural examination: Secondary | ICD-10-CM | POA: Insufficient documentation

## 2010-05-06 DIAGNOSIS — Z01812 Encounter for preprocedural laboratory examination: Secondary | ICD-10-CM | POA: Insufficient documentation

## 2010-05-06 DIAGNOSIS — I2510 Atherosclerotic heart disease of native coronary artery without angina pectoris: Secondary | ICD-10-CM

## 2010-05-06 LAB — HEMOGLOBIN A1C: Mean Plasma Glucose: 111 mg/dL (ref <117)

## 2010-05-06 LAB — BLOOD GAS, ARTERIAL
Acid-Base Excess: 0.7 mmol/L (ref 0.0–2.0)
Drawn by: 206361
O2 Saturation: 97.1 %
Patient temperature: 98.6
pCO2 arterial: 36.7 mmHg (ref 35.0–45.0)

## 2010-05-06 LAB — CBC
MCH: 31.2 pg (ref 26.0–34.0)
MCHC: 35.5 g/dL (ref 30.0–36.0)
Platelets: 188 10*3/uL (ref 150–400)

## 2010-05-06 LAB — URINALYSIS, ROUTINE W REFLEX MICROSCOPIC
Bilirubin Urine: NEGATIVE
Ketones, ur: NEGATIVE mg/dL
Nitrite: NEGATIVE
Protein, ur: NEGATIVE mg/dL

## 2010-05-06 LAB — COMPREHENSIVE METABOLIC PANEL
AST: 18 U/L (ref 0–37)
Alkaline Phosphatase: 57 U/L (ref 39–117)
BUN: 18 mg/dL (ref 6–23)
CO2: 20 mEq/L (ref 19–32)
Chloride: 109 mEq/L (ref 96–112)
Creatinine, Ser: 1.17 mg/dL (ref 0.4–1.5)
GFR calc Af Amer: >60 mL/min (ref >60)
GFR calc non Af Amer: >60 mL/min (ref >60)
Potassium: 4.5 mEq/L (ref 3.5–5.1)
Total Bilirubin: 0.9 mg/dL (ref 0.3–1.2)

## 2010-05-06 LAB — SURGICAL PCR SCREEN: Staphylococcus aureus: POSITIVE — AB

## 2010-05-07 ENCOUNTER — Telehealth: Payer: Self-pay | Admitting: Cardiology

## 2010-05-07 NOTE — Procedures (Signed)
NAME:  [name], [name] NO.:  [id number]  MEDICAL RECORD NO.:  [id number]            PATIENT TYPE:  LOCATION:                                 FACILITY:  PHYSICIAN:  Marca Ancona, MD           DATE OF BIRTH:  DATE OF PROCEDURE: DATE OF DISCHARGE:                           CARDIAC CATHETERIZATION   PROCEDURES: 1. Right heart catheterization. 2. Coronary angiography.  INDICATIONS:  This is a 70 year old who has had moderate aortic stenosis followed for a number of years.  On his most recent echo last month, aortic stenosis was noted to have progressed to severe with mean gradient of over 40 mmHg.  The patient did not describe much in the way of symptomatology, therefore I had him to do an exercise treadmill test to see if he had normal blood pressure response in the face of his aortic stenosis.  The patient did have a blunted blood pressure response where his blood pressure did not rise with exertion.  Additionally, he had significant ST-segment depression in a number of leads.  I therefore decided that his aortic stenosis was most likely hemodynamically significant and therefore I set him up for a left and right heart catheterization.  PROCEDURE NOTE:  After informed consent was obtained, the right groin was sterilely prepped and draped.  Lidocaine 1% was used to locally anesthetize the right groin area.  The right common femoral vein was entered using modified Seldinger technique.  A 7-French venous sheath was placed.  Right common femoral artery was entered using modified Seldinger technique and a 4-French arterial sheath was placed.  The right heart catheterization was carried out using balloon-tip Swan-Ganz catheter.  The right coronary artery was engaged using the JR-4 catheter and the left coronary artery was best engaged using the JL-5 catheter. There were no complications.  The aortic valve was not crossed as we knew that he has severe aortic stenosis  by a good quality echocardiogram.  FINDINGS: 1. Hemodynamics:  Mean right atrial pressure 7 mmHg, RV 38/12, PA     35/14 with mean PA pressure 22 mmHg, mean pulmonary capillary wedge     pressure 11 mmHg, cardiac index 2.3, mixed venous oxygen saturation     67%. 2. Left ventriculography:  Left ventriculogram was not done as we do     know he has severe aortic stenosis by echo. 3. Right coronary artery:  The right coronary artery was a dominant     vessel.  There was a 30% mid RCA stenosis and there was a 40% mid     PDA stenosis.  The PDA was a large vessel. 4. Left main:  Left main had minimal disease. 5. Left circumflex system:  There was an 80% mid circumflex stenosis.     This was followed by a first obtuse marginal with luminal     irregularities as well as PLOM with luminal irregularities.  The     continuation of AV circumflex was a very small vessel. 6. LAD system:  There was a 30% proximal LAD stenosis at the site of  the takeoff of first diagonal.  The first diagonal was a moderate-     to-large vessel.  There was actually 95% ostial stenosis in the     first diagonal.  The distal LAD was a small vessel.  It did enlarge     somewhat with intracoronary nitroglycerin.  There was only about 30-     40% discrete stenosis in the distal LAD.  IMPRESSION:  This is a 70 year old with severe aortic stenosis by echocardiogram.  He had an abnormal exercise treadmill test with a blunted blood pressure response to exercise as well as ST-segment depression.  Heart catheterization shows an 80% mid circumflex stenosis and a 95% stenosis in the ostial moderate to large first diagonal.  This is the likely cause of his ST depression on exercise treadmill test.  I am going to plan to have him evaluated for aortic valve replacement.  He will need bypass surgery with it, so I am going to increase his Lipitor and also start him on some carvedilol.     Marca Ancona, MD     DM/MEDQ   D:  04/27/2010  T:  04/28/2010  Job:  [id number]  cc:   Salvatore Decent. [name], M.D.  Electronically Signed by Marca Ancona MD on 05/07/2010 10:37:24 AM

## 2010-05-10 ENCOUNTER — Other Ambulatory Visit: Payer: Self-pay | Admitting: Thoracic Surgery (Cardiothoracic Vascular Surgery)

## 2010-05-10 ENCOUNTER — Inpatient Hospital Stay (HOSPITAL_COMMUNITY)
Admission: RE | Admit: 2010-05-10 | Discharge: 2010-05-16 | DRG: 220 | Disposition: A | Discharge status: Home / Self Care | Payer: Medicare Other | Source: Ambulatory Visit | Attending: Thoracic Surgery (Cardiothoracic Vascular Surgery) | Admitting: Thoracic Surgery (Cardiothoracic Vascular Surgery)

## 2010-05-10 ENCOUNTER — Inpatient Hospital Stay (HOSPITAL_COMMUNITY): Payer: Medicare Other

## 2010-05-10 DIAGNOSIS — B009 Herpesviral infection, unspecified: Secondary | ICD-10-CM | POA: Diagnosis present

## 2010-05-10 DIAGNOSIS — E785 Hyperlipidemia, unspecified: Secondary | ICD-10-CM | POA: Diagnosis present

## 2010-05-10 DIAGNOSIS — I4891 Unspecified atrial fibrillation: Secondary | ICD-10-CM | POA: Diagnosis not present

## 2010-05-10 DIAGNOSIS — Z7901 Long term (current) use of anticoagulants: Secondary | ICD-10-CM

## 2010-05-10 DIAGNOSIS — D62 Acute posthemorrhagic anemia: Secondary | ICD-10-CM | POA: Diagnosis not present

## 2010-05-10 DIAGNOSIS — I2510 Atherosclerotic heart disease of native coronary artery without angina pectoris: Secondary | ICD-10-CM

## 2010-05-10 DIAGNOSIS — N400 Enlarged prostate without lower urinary tract symptoms: Secondary | ICD-10-CM | POA: Diagnosis present

## 2010-05-10 DIAGNOSIS — I10 Essential (primary) hypertension: Secondary | ICD-10-CM | POA: Diagnosis present

## 2010-05-10 DIAGNOSIS — R110 Nausea: Secondary | ICD-10-CM | POA: Diagnosis not present

## 2010-05-10 DIAGNOSIS — E8779 Other fluid overload: Secondary | ICD-10-CM | POA: Diagnosis not present

## 2010-05-10 DIAGNOSIS — I359 Nonrheumatic aortic valve disorder, unspecified: Secondary | ICD-10-CM

## 2010-05-10 DIAGNOSIS — N289 Disorder of kidney and ureter, unspecified: Secondary | ICD-10-CM | POA: Diagnosis not present

## 2010-05-10 DIAGNOSIS — D696 Thrombocytopenia, unspecified: Secondary | ICD-10-CM | POA: Diagnosis not present

## 2010-05-10 DIAGNOSIS — Z7982 Long term (current) use of aspirin: Secondary | ICD-10-CM

## 2010-05-10 LAB — GLUCOSE, CAPILLARY
Glucose-Capillary: 80 mg/dL (ref 70–99)
Glucose-Capillary: 95 mg/dL (ref 70–99)
Glucose-Capillary: 134 mg/dL — ABNORMAL HIGH (ref 70–99)

## 2010-05-10 LAB — POCT I-STAT 3, ART BLOOD GAS (G3+)
Acid-base deficit: 3 mmol/L — ABNORMAL HIGH (ref 0.0–2.0)
Bicarbonate: 26.3 mEq/L — ABNORMAL HIGH (ref 20.0–24.0)
Bicarbonate: 23.7 mEq/L (ref 20.0–24.0)
Bicarbonate: 22 mEq/L (ref 20.0–24.0)
O2 Saturation: 100 %
O2 Saturation: 99 %
Patient temperature: 37.7
TCO2: 27 mmol/L (ref 0–100)
TCO2: 25 mmol/L (ref 0–100)
TCO2: 22 mmol/L (ref 0–100)
TCO2: 23 mmol/L (ref 0–100)
pCO2 arterial: 37.5 mmHg (ref 35.0–45.0)
pCO2 arterial: 39.2 mmHg (ref 35.0–45.0)
pCO2 arterial: 30.7 mmHg — ABNORMAL LOW (ref 35.0–45.0)
pCO2 arterial: 39.1 mmHg (ref 35.0–45.0)
pH, Arterial: 7.453 — ABNORMAL HIGH (ref 7.350–7.450)
pH, Arterial: 7.39 (ref 7.350–7.450)
pH, Arterial: 7.362 (ref 7.350–7.450)
pO2, Arterial: 382 mmHg — ABNORMAL HIGH (ref 80.0–100.0)

## 2010-05-10 LAB — POCT I-STAT 4, (NA,K, GLUC, HGB,HCT)
Glucose, Bld: 127 mg/dL — ABNORMAL HIGH (ref 70–99)
Glucose, Bld: 130 mg/dL — ABNORMAL HIGH (ref 70–99)
Glucose, Bld: 134 mg/dL — ABNORMAL HIGH (ref 70–99)
Glucose, Bld: 83 mg/dL (ref 70–99)
HCT: 34 % — ABNORMAL LOW (ref 39.0–52.0)
HCT: 26 % — ABNORMAL LOW (ref 39.0–52.0)
HCT: 27 % — ABNORMAL LOW (ref 39.0–52.0)
HCT: 23 % — ABNORMAL LOW (ref 39.0–52.0)
Hemoglobin: 11.9 g/dL — ABNORMAL LOW (ref 13.0–17.0)
Hemoglobin: 11.6 g/dL — ABNORMAL LOW (ref 13.0–17.0)
Hemoglobin: 9.2 g/dL — ABNORMAL LOW (ref 13.0–17.0)
Hemoglobin: 7.8 g/dL — ABNORMAL LOW (ref 13.0–17.0)
Potassium: 3.8 mEq/L (ref 3.5–5.1)
Potassium: 4.2 mEq/L (ref 3.5–5.1)
Potassium: 3.9 mEq/L (ref 3.5–5.1)
Sodium: 140 mEq/L (ref 135–145)
Sodium: 128 mEq/L — ABNORMAL LOW (ref 135–145)
Sodium: 130 mEq/L — ABNORMAL LOW (ref 135–145)
Sodium: 139 mEq/L (ref 135–145)

## 2010-05-10 LAB — CBC
HCT: 25.7 % — ABNORMAL LOW (ref 39.0–52.0)
Hemoglobin: 9.1 g/dL — ABNORMAL LOW (ref 13.0–17.0)
MCH: 31.3 pg (ref 26.0–34.0)
MCHC: 35.4 g/dL (ref 30.0–36.0)
MCV: 88 fL (ref 78.0–100.0)
MCV: 88.5 fL (ref 78.0–100.0)
Platelets: 103 10*3/uL — ABNORMAL LOW (ref 150–400)
RBC: 2.92 MIL/uL — ABNORMAL LOW (ref 4.22–5.81)
RDW: 12.7 % (ref 11.5–15.5)
RDW: 13 % (ref 11.5–15.5)
WBC: 9.4 10*3/uL (ref 4.0–10.5)

## 2010-05-10 LAB — POCT I-STAT, CHEM 8
Creatinine, Ser: 1.3 mg/dL (ref 0.4–1.5)
Glucose, Bld: 149 mg/dL — ABNORMAL HIGH (ref 70–99)
HCT: 20 % — ABNORMAL LOW (ref 39.0–52.0)
Hemoglobin: 6.8 g/dL — CL (ref 13.0–17.0)
TCO2: 20 mmol/L (ref 0–100)

## 2010-05-10 LAB — APTT: aPTT: 46 seconds — ABNORMAL HIGH (ref 24–37)

## 2010-05-10 LAB — PREPARE RBC (CROSSMATCH)

## 2010-05-10 LAB — PROTIME-INR: INR: 1.53 — ABNORMAL HIGH (ref 0.00–1.49)

## 2010-05-10 LAB — CREATININE, SERUM: GFR calc Af Amer: >60 mL/min (ref >60)

## 2010-05-10 LAB — PLATELET COUNT: Platelets: 132 10*3/uL — ABNORMAL LOW (ref 150–400)

## 2010-05-11 ENCOUNTER — Encounter: Payer: Medicare Other | Admitting: Cardiology

## 2010-05-11 ENCOUNTER — Inpatient Hospital Stay (HOSPITAL_COMMUNITY): Payer: Medicare Other

## 2010-05-11 LAB — GLUCOSE, CAPILLARY
Glucose-Capillary: 121 mg/dL — ABNORMAL HIGH (ref 70–99)
Glucose-Capillary: 143 mg/dL — ABNORMAL HIGH (ref 70–99)

## 2010-05-11 LAB — BASIC METABOLIC PANEL
BUN: 17 mg/dL (ref 6–23)
CO2: 23 mEq/L (ref 19–32)
Chloride: 113 mEq/L — ABNORMAL HIGH (ref 96–112)
Potassium: 4.5 mEq/L (ref 3.5–5.1)

## 2010-05-11 LAB — CBC
HCT: 18.2 % — ABNORMAL LOW (ref 39.0–52.0)
Hemoglobin: 6.3 g/dL — CL (ref 13.0–17.0)
MCV: 86.3 fL (ref 78.0–100.0)
Platelets: 70 10*3/uL — ABNORMAL LOW (ref 150–400)
RBC: 2.11 MIL/uL — ABNORMAL LOW (ref 4.22–5.81)
WBC: 8.7 10*3/uL (ref 4.0–10.5)

## 2010-05-11 LAB — MAGNESIUM: Magnesium: 2.6 mg/dL — ABNORMAL HIGH (ref 1.5–2.5)

## 2010-05-11 NOTE — Progress Notes (Signed)
Summary: RX   Phone Note Refill Request Call back at Home Phone [phone] Message from:  Patient on May 07, 2010 10:38 AM  Pt is calling about Lipitor being increased.  Initial call taken by: Harlon Flor,  May 07, 2010 10:38 AM  Follow-up for Phone Call        Per wife, Dr. Shirlee Latch increased Lipitor to 40mg  once daily after pt had his catheterization this past week. pt was calling to see if we could call in script, per wife she has already taken the script to the pharmacy to be filled. Notified wife that if he has any questions he can give our office a call.  Follow-up by: Lysbeth Galas CMA,  May 07, 2010 10:54 AM

## 2010-05-12 ENCOUNTER — Inpatient Hospital Stay (HOSPITAL_COMMUNITY): Payer: Medicare Other

## 2010-05-12 LAB — CBC
HCT: 30.9 % — ABNORMAL LOW (ref 39.0–52.0)
Hemoglobin: 10.4 g/dL — ABNORMAL LOW (ref 13.0–17.0)
MCH: 29.7 pg (ref 26.0–34.0)
MCV: 88.3 fL (ref 78.0–100.0)
RBC: 3.5 MIL/uL — ABNORMAL LOW (ref 4.22–5.81)
WBC: 15.8 10*3/uL — ABNORMAL HIGH (ref 4.0–10.5)

## 2010-05-12 LAB — TYPE AND SCREEN
Unit division: 0
Unit division: 0

## 2010-05-12 LAB — GLUCOSE, CAPILLARY: Glucose-Capillary: 123 mg/dL — ABNORMAL HIGH (ref 70–99)

## 2010-05-12 LAB — PROTIME-INR: Prothrombin Time: 16.5 seconds — ABNORMAL HIGH (ref 11.6–15.2)

## 2010-05-12 LAB — BASIC METABOLIC PANEL
BUN: 28 mg/dL — ABNORMAL HIGH (ref 6–23)
Glucose, Bld: 127 mg/dL — ABNORMAL HIGH (ref 70–99)
Sodium: 137 mEq/L (ref 135–145)

## 2010-05-12 NOTE — Op Note (Signed)
NAMESEVASTIAN, WITCZAK               ACCOUNT NO.:  [id number]  MEDICAL RECORD NO.:  [id number]           PATIENT TYPE:  I  LOCATION:  2310                         FACILITY:  [facility]  PHYSICIAN:  Burna Forts, M.D.DATE OF BIRTH:  11-23-1940  DATE OF PROCEDURE:  05/10/2010 DATE OF DISCHARGE:                              OPERATIVE REPORT   INDICATIONS FOR PROCEDURE:  [name] is a 70 year old gentleman who presents today to Dr. Charlett Lango with a diagnosis of aortic stenosis and coronary artery disease.  He is scheduled for aortic valve replacement and coronary artery bypass grafting.  We planned to place the TEE probe for evaluation of cardiac structures both pre and post bypass.  The patient was brought to the holding area on the morning of surgery where under local anesthesia with sedation where arterial and pulmonary artery lines were inserted.  He was then taken to the OR for induction of general anesthesia after which the TEE probe was prepared and passed oropharyngeally into the stomach, then slightly withdrawn from the cardiac structures.  PRECARDIOPULMONARY BYPASS TEE EXAMINATION:  Left ventricle.  There is good overall contractile pattern appreciated in a short axis view of the left ventricular chamber.  There is concentric left ventricular hypertrophy.  Papillary muscles are well outlined, and there are no masses appreciated within the left ventricular chamber itself.  There is satisfactory contractile pattern appreciated in all segmental wall areas in both short and long-axis views.  Mitral valve.  There was a median noticed in a four-chamber view of the mitral area.  There appears to be significant calcium in the annular area such that there is almost a ring appearance of the annular area of the mitral valve.  There are essentially normal to slightly thickened leaflets of the anterior and posterior leaflets; however, they appear to open satisfactorily during  diastolic inflow and close appropriately and coapt just below the level of the annular area.  Color Doppler is used, which shows essentially no regurgitant flow across this valve.  Left atrium.  This is a normal left atrial chamber.  No masses appreciated within.  Right ventricle, right atrium, and tricuspid valve are normal.  Aortic valve.  At the short axis view at the level the aortic valve, there is heavy calcium appreciated.  There does appear to be 3 cusps; however, all are severely restricted in their opening and motion such that there is only a thin line of opening between the left and noncoronary cusps and a thin line of opening between the right and noncoronary cusp.  Otherwise, there is heavy calcium and severe restriction of motion.  A long-axis view of the outflow tract in the aorta aortic valve reveals a significant turbulent jet above the level of the aortic valve indicating severe AS.  There is also a small jet of aortic insufficiency appreciated in the LVOT, which will be considered mild aortic insufficiency.  Deep transgastric views were attempted, but we were unable to measure gradient with a deep transgastric view due to inability to get accurate alignment with the probe.  This does appear to be overall significant and severe  aortic stenosis.  The patient was placed on cardiopulmonary bypass.  Hypothermia was begun.  The coronary artery bypass grafting was carried out followed by the placement of the pericardial tissue valve.  Rewarming and de-airing maneuvers were carried out and the patient was separated from cardiopulmonary bypass with initial attempt.  POST CARDIOPULMONARY BYPASS TEE EXAM:  Limited exam.  Left ventricle.  The left ventricular chamber seen in a short axis view in the early post bypass period.  They are also called kissing papillary muscles indicating a low ventricular volume status; however, with time and separation from cardiopulmonary  bypass, this improved with the addition of volume and reinfusion.  Overall, the contractile pattern is good in this post bypass period seen both in short and long-axis views.  Aortic valve.  In place of the diseased aortic valve can now be seen the struts and thin leaflet edges of the tissue valve placed by the cardiac surgeon.  The edges of the leaflets were seen.  They opened appropriately during systolic contraction and closed satisfactorily. Color Doppler shows no obstruction to flow and no aortic insufficiency is appreciated.  The valve appears well seated, well approximated, and this appears to be entirely satisfactory repair.  The patient is returned to the cardiac intensive care unit in a stable condition.          ______________________________ Burna Forts, M.D.     JTM/MEDQ  D:  05/10/2010  T:  05/11/2010  Job:  [id number]  cc:   OR at [id number] Anesthesia at [id number]  Electronically Signed by Ester Rink M.D. on 05/12/2010 06:02:56 AM

## 2010-05-13 HISTORY — PX: OTHER SURGICAL HISTORY: SHX169

## 2010-05-13 LAB — HEMOCCULT GUIAC POC 1CARD (OFFICE): Fecal Occult Bld: NEGATIVE

## 2010-05-13 LAB — BASIC METABOLIC PANEL
BUN: 37 mg/dL — ABNORMAL HIGH (ref 6–23)
CO2: 25 mEq/L (ref 19–32)
Chloride: 102 mEq/L (ref 96–112)
Glucose, Bld: 178 mg/dL — ABNORMAL HIGH (ref 70–99)
Potassium: 5.3 mEq/L — ABNORMAL HIGH (ref 3.5–5.1)

## 2010-05-13 LAB — CBC
HCT: 27.7 % — ABNORMAL LOW (ref 39.0–52.0)
Hemoglobin: 9.4 g/dL — ABNORMAL LOW (ref 13.0–17.0)
MCV: 89.6 fL (ref 78.0–100.0)
Platelets: 71 10*3/uL — ABNORMAL LOW (ref 150–400)
RBC: 3.09 MIL/uL — ABNORMAL LOW (ref 4.22–5.81)
WBC: 13.8 10*3/uL — ABNORMAL HIGH (ref 4.0–10.5)

## 2010-05-14 LAB — BASIC METABOLIC PANEL
CO2: 27 mEq/L (ref 19–32)
Calcium: 8.4 mg/dL (ref 8.4–10.5)
GFR calc Af Amer: >60 mL/min (ref >60)
Potassium: 4.8 mEq/L (ref 3.5–5.1)
Sodium: 135 mEq/L (ref 135–145)

## 2010-05-15 LAB — BASIC METABOLIC PANEL
GFR calc non Af Amer: 54 mL/min — ABNORMAL LOW (ref >60)
Glucose, Bld: 112 mg/dL — ABNORMAL HIGH (ref 70–99)
Potassium: 4.8 mEq/L (ref 3.5–5.1)
Sodium: 137 mEq/L (ref 135–145)

## 2010-05-15 LAB — CBC
Hemoglobin: 9.1 g/dL — ABNORMAL LOW (ref 13.0–17.0)
MCH: 30.6 pg (ref 26.0–34.0)
RBC: 2.97 MIL/uL — ABNORMAL LOW (ref 4.22–5.81)

## 2010-05-15 LAB — PROTIME-INR
INR: 2.34 — ABNORMAL HIGH (ref 0.00–1.49)
Prothrombin Time: 25.8 seconds — ABNORMAL HIGH (ref 11.6–15.2)

## 2010-05-16 LAB — PROTIME-INR: Prothrombin Time: 32 seconds — ABNORMAL HIGH (ref 11.6–15.2)

## 2010-05-18 ENCOUNTER — Encounter: Payer: Self-pay | Admitting: Cardiology

## 2010-05-18 ENCOUNTER — Encounter (INDEPENDENT_AMBULATORY_CARE_PROVIDER_SITE_OTHER): Payer: Medicare Other

## 2010-05-18 DIAGNOSIS — I359 Nonrheumatic aortic valve disorder, unspecified: Secondary | ICD-10-CM

## 2010-05-18 DIAGNOSIS — Z7901 Long term (current) use of anticoagulants: Secondary | ICD-10-CM

## 2010-05-18 LAB — CONVERTED CEMR LAB: POC INR: 3.3

## 2010-05-18 NOTE — Op Note (Addendum)
[name], [name]               ACCOUNT NO.:  [id number]  MEDICAL RECORD NO.:  [id number]           PATIENT TYPE:  I  LOCATION:  2310                         FACILITY:  [facility]  PHYSICIAN:  Salvatore Decent. [name], M.D.DATE OF BIRTH:  09/23/1940  DATE OF PROCEDURE:  05/10/2010 DATE OF DISCHARGE:                              OPERATIVE REPORT   PREOPERATIVE DIAGNOSIS:  Severe aortic stenosis and two-vessel coronary disease.  POSTOPERATIVE DIAGNOSIS:  Severe aortic stenosis and two-vessel coronary disease.  PROCEDURES:  A median sternotomy extracorporeal circulation coronary artery bypass grafting x2 (saphenous vein graft to first diagonal, saphenous vein graft to obtuse marginal one), aortic valve replacement with 23-mm [facility] Ease, pericardial valve (model number [id number], serial number [id number]), endoscopic vein harvest, right thigh.  SURGEON:  Salvatore Decent. [name], MD  ASSISTANT:  Stephanie Acre. Dasovich, PA  ANESTHESIA:  General.  FINDINGS:  Tricuspid calcific stenotic aortic valve, moderate annular calcification, left ventricular hypertrophy, good-quality targets, good- quality conduits, LAD intramyocardial.  CLINICAL NOTE:  [name] is a 70 year old gentleman who presents with recent onset of exertional angina.  Echocardiography revealed progression of aortic stenosis for moderate-to-severe cardiac catheterization, he was found to have two-vessel coronary disease with the lesion in the mid circumflex as well as ostial lesion in the first diagonal.  The patient was advised to undergo aortic valve replacement and coronary bypass grafting.  Indications, risks, benefits, and alternatives were discussed in detail with the patient as outlined in the consultation note.  He understood and accepted the risks and agreed to proceed.  We did discuss valve options including mechanical and tissue valves, given the patient's age greater than 63 and desired to avoid need for  lifelong anticoagulation, pericardial valve was selected.  OPERATIVE NOTE:  [name] was brought to the preop holding area on May 10, 2010.  There, the Anesthesia Service placed a Swan-Ganz catheter and arterial blood pressure monitoring line.  Intravenous antibiotics were administered.  The patient was taken to the operating room, anesthetized and intubated.  Transesophageal echocardiography was performed.  It revealed a tricuspid calcific stenotic aortic valve. There was mild aortic insufficiency.  Gradings were difficult to measure due to angulation.  There was no other significant valvular pathology, there was left ventricular hypertrophy, but preserved left ventricular systolic function.  Foley catheter was placed.  The chest, abdomen, and legs were prepped and draped in usual sterile fashion.  Incision was made in the medial aspect of the right leg at the level of the knee and greater saphenous vein was identified, and was harvested endoscopically from the right thigh.  It was a good-quality vessel. Simultaneously a median sternotomy was performed.  The patient was fully heparinized.  This was done prior to beginning to divide the branches of the vein.  After confirming adequate anticoagulation with ACT measurement, the pericardium was opened.  The ascending aorta was cannulated via concentric 2-0 Ethibond pledgeted purse-string sutures. Of note, the ascending aorta was relatively thin, but normal in size.  A dual stage venous cannula was placed via purse-string suture in the rectal appendage.  Cardiopulmonary bypass instituted and the patient was  cooled to 32 degrees Celsius.  The coronary arteries were inspected and anastomotic sites were chosen.  Conduits were inspected and cut to length.  A foam pad was placed in the pericardium to insulate the heart and the protect the left phrenic nerve.  A temperature probe was placed in myocardial septum.  The left ventricular vent  was placed via purse- string suture in the right superior pulmonary vein and draped into the left ventricle.  Retrograde cardioplegic cannula was placed via purse- string suture in the right atrium and draped in the coronary sinus and antegrade cardioplegic cannula was placed in the ascending aorta.  The aorta was crossclamped.  The left ventricle was emptied via the vents.  Cardiac arrest then was achieved combination of cold antegrade and retrograde blood cardioplegia and topical iced saline.  Initial 500 mL of cardioplegia was administered antegrade.  This resulted in a rapid diastolic arrest.  Additional 750 mL of cardioplegia was administered to retrograde.  This resulted in myocardial septal cooling to 10 degrees Celsius.  A reversed saphenous vein graft was placed end-to-side to obtuse marginal one.  This was a 1.5-mm good-quality target vessel.  The vein was of good quality.  The anastomosis performed end-to-side with a running 7-0 Prolene suture.  Cardioplegia was administered through the graft.  There was good flow and good hemostasis.  Next, a reverse saphenous vein graft was placed inside the first diagonal branch to the LAD.  This was large anterolateral branch, it was a 1.5-mm vessel.  The vein graft again was of good quality, was anastomosed end-to-side with a running 7-0 Prolene suture.  Cardioplegia was administered.  There was good flow and good hemostasis.  Additional cardioplegia was administered via the retrograde cannula and additional doses of cardioplegia were given retrograde to 20-minute intervals during the valve replacement portion of the procedure.  An aortotomy was performed.  The aortic valve was inspected.  It was tricuspid, calcific, and stenotic.  The valve leaflets were excised. The annular calcium was debrided taking care to control all calcific debris.  After freeing the annulus, it was copiously irrigated with iced saline.  The annulus sized for  a 23-mm Magna Ease pericardial valve that was prepared per manufacturer's recommendations.  The coronary ostia arose in their normal locations, 2-0 Ethibond horizontal mattress sutures with subannular pledgets were placed circumferentially around the annulus, 15 sutures were utilized.  The sutures then were passed through the sewing ring of the valve, was lowered into place and the suture sequentially tied.  The annulus was carefully inspected and probed with a fine tip right-angle clamp which revealed no gaps in the annular closure.  Valve was well seated.  Coronary ostia were inspected and there was no impingement on them.  Rewarming was begun.  The aortotomy then was closed in two layers with running 4-0 Prolene suture; the first layer was a running horizontal mattress suture followed by running simple suture.  Due to the thin nature of the aorta, Teflon felt strips were used on the proximal and distal aorta to buttress the suture line.  The vein grafts were cut to length.  The cardioplegic cannula was removed from the ascending aorta and the proximal vein graft anastomoses were performed to 4.5-mm punch aortotomies with running 6-0 Prolene sutures.  After completing the final proximal anastomosis before tying the suture, one dose of retrograde cardioplegia was administered.  De- airing maneuvers were performed.  Lidocaine was administered after completely de-airing the left ventricle and aortic root.  The aortic crossclamp was removed.  Total crossclamp time was 96 minutes.  The patient initially fibrillated and required single defibrillation with 10 joules and then was in sinus rhythm thereafter.  As rewarming was completed, all proximal and distal anastomoses were inspected for hemostasis.  The patient was bradycardic.  Epicardial pacing wires were placed on the right ventricle and right atrium and AOO pacing was initiated at 80 beats per minute.  Additional de-airing  was performed using a 16-gauge Angiocatheter, the apex of the left ventricle prior to weaning from bypass.  The patient had rewarmed to a core temperature of 37 degrees Celsius.  He was weaned from cardiopulmonary bypass on the first attempt without inotropic support.  Total bypass time was 136 minutes.  Initial cardiac index was greater than 2 liters per minute per meter squared and the patient remained hemodynamically stable throughout the postbypass period.  Postbypass transesophageal echocardiography revealed good function of the prosthetic valve with no perivalvular leaks and was otherwise unchanged.  A test dose of protamine was administered and was well tolerated.  The remainder of the protamine was administered slowly.  Hemostasis was achieved, the chest irrigated with warm saline.  Pericardium was reapproximated with interrupted 3-0 silk sutures.  It came together easily without tension and without kinking of the underlying grafts.  A 32-French Blake drain was placed in the anterior mediastinum.  The sternum was closed with combination of single and double heavy gauge stainless steel wires.  The pectoralis fascia, subcutaneous tissue, and skin were closed in standard fashion.  All sponge, needle, and instrument counts were correct at the end of the procedure.  The patient was taken from the operating room to the surgical intensive care unit in fair condition.     Salvatore Decent [name], M.D.     SCH/MEDQ  D:  05/10/2010  T:  05/11/2010  Job:  [id number]  cc:   Marca Ancona, MD Dwana Curd. Para March, M.D.  Electronically Signed by Charlett Lango M.D. on 05/18/2010 12:31:09 PM Electronically Signed by Charlett Lango M.D. on 05/18/2010 12:49:44 PM Electronically Signed by Charlett Lango M.D. on 05/18/2010 01:07:45 PM Electronically Signed by Charlett Lango M.D. on 05/18/2010 01:28:40 PM Electronically Signed by Charlett Lango M.D. on 05/18/2010 01:49:45  PM Electronically Signed by Charlett Lango M.D. on 05/18/2010 02:09:32 PM Electronically Signed by Charlett Lango M.D. on 05/18/2010 02:30:12 PM Electronically Signed by Charlett Lango M.D. on 05/18/2010 02:53:19 PM Electronically Signed by Charlett Lango M.D. on 05/18/2010 03:17:44 PM Electronically Signed by Charlett Lango M.D. on 05/18/2010 03:42:28 PM Electronically Signed by Charlett Lango M.D. on 05/18/2010 04:19:10 PM Electronically Signed by Charlett Lango M.D. on 05/18/2010 04:50:19 PM Electronically Signed by Charlett Lango M.D. on 05/18/2010 04:50:19 PM Electronically Signed by Charlett Lango M.D. on 05/18/2010 07:36:58 PM

## 2010-05-24 ENCOUNTER — Ambulatory Visit
Admission: RE | Admit: 2010-05-24 | Discharge: 2010-05-24 | Disposition: A | Discharge status: Home / Self Care | Payer: Medicare Other | Source: Ambulatory Visit | Attending: Thoracic Surgery (Cardiothoracic Vascular Surgery) | Admitting: Thoracic Surgery (Cardiothoracic Vascular Surgery)

## 2010-05-24 ENCOUNTER — Ambulatory Visit (INDEPENDENT_AMBULATORY_CARE_PROVIDER_SITE_OTHER): Payer: Self-pay

## 2010-05-24 ENCOUNTER — Encounter: Payer: Self-pay | Admitting: Cardiology

## 2010-05-24 ENCOUNTER — Other Ambulatory Visit: Payer: Self-pay | Admitting: Thoracic Surgery (Cardiothoracic Vascular Surgery)

## 2010-05-24 DIAGNOSIS — Z952 Presence of prosthetic heart valve: Secondary | ICD-10-CM

## 2010-05-24 DIAGNOSIS — I359 Nonrheumatic aortic valve disorder, unspecified: Secondary | ICD-10-CM

## 2010-05-24 DIAGNOSIS — I2510 Atherosclerotic heart disease of native coronary artery without angina pectoris: Secondary | ICD-10-CM

## 2010-05-24 HISTORY — DX: Nonrheumatic aortic valve disorder, unspecified: I35.9

## 2010-05-24 HISTORY — DX: Presence of prosthetic heart valve: Z95.2

## 2010-05-25 ENCOUNTER — Encounter: Payer: Self-pay | Admitting: Cardiovascular Disease

## 2010-05-25 ENCOUNTER — Encounter (INDEPENDENT_AMBULATORY_CARE_PROVIDER_SITE_OTHER): Payer: Medicare Other

## 2010-05-25 DIAGNOSIS — Z7901 Long term (current) use of anticoagulants: Secondary | ICD-10-CM

## 2010-05-25 DIAGNOSIS — I359 Nonrheumatic aortic valve disorder, unspecified: Secondary | ICD-10-CM

## 2010-05-25 LAB — CONVERTED CEMR LAB: POC INR: 2.1

## 2010-05-25 NOTE — Medication Information (Signed)
Summary: [facility]   Anticoagulant Therapy  Managed by: Windell Hummingbird, RN PCP: Crawford Givens MD Supervising MD: Antoine Poche MD, Fayrene Fearing Indication 1: aortic Valve Replacement Valve Position: Aortic INR POC 3.3 INR RANGE 2.0-3.0  Dietary changes: no    Health status changes: no    Bleeding/hemorrhagic complications: no    Recent/future hospitalizations: no    Any changes in medication regimen? no    Recent/future dental: no  Any missed doses?: no       Is patient compliant with meds? yes       Allergies: 1)  ! Paxil  Anticoagulation Management History:      The patient is taking warfarin and comes in today for a routine follow up visit.  Positive risk factors for bleeding include an age of 69 years or older.  The bleeding index is 'intermediate risk'.  Positive CHADS2 values include History of HTN.  Negative CHADS2 values include Age > 68 years old.  His last INR was 1.0 ratio.  Anticoagulation responsible provider: Antoine Poche MD, Fayrene Fearing.  INR POC: 3.3.  Cuvette Lot#: [id number].  Exp: 03/2011.    Anticoagulation Management Assessment/Plan:      The next INR is due 05/25/2010.  Results were reviewed/authorized by Windell Hummingbird, RN.  He was notified by Windell Hummingbird, RN.         Current Anticoagulation Instructions: INR 3.3 Skip today's dose.  Then resume taking 1 tablet every day. Recheck in 1 week.

## 2010-05-27 ENCOUNTER — Encounter (INDEPENDENT_AMBULATORY_CARE_PROVIDER_SITE_OTHER): Payer: Medicare Other | Admitting: Thoracic Surgery (Cardiothoracic Vascular Surgery)

## 2010-05-27 ENCOUNTER — Inpatient Hospital Stay (HOSPITAL_COMMUNITY)
Admission: AD | Admit: 2010-05-27 | Discharge: 2010-06-02 | DRG: 237 | Disposition: A | Discharge status: Home Health | Payer: Medicare Other | Source: Ambulatory Visit | Attending: Thoracic Surgery (Cardiothoracic Vascular Surgery) | Admitting: Thoracic Surgery (Cardiothoracic Vascular Surgery)

## 2010-05-27 ENCOUNTER — Other Ambulatory Visit: Payer: Self-pay | Admitting: Thoracic Surgery (Cardiothoracic Vascular Surgery)

## 2010-05-27 ENCOUNTER — Ambulatory Visit
Admission: RE | Admit: 2010-05-27 | Discharge: 2010-05-27 | Disposition: A | Discharge status: Home / Self Care | Payer: Medicare Other | Source: Ambulatory Visit | Attending: Thoracic Surgery (Cardiothoracic Vascular Surgery) | Admitting: Thoracic Surgery (Cardiothoracic Vascular Surgery)

## 2010-05-27 ENCOUNTER — Telehealth: Payer: Self-pay | Admitting: Cardiology

## 2010-05-27 DIAGNOSIS — I10 Essential (primary) hypertension: Secondary | ICD-10-CM | POA: Diagnosis present

## 2010-05-27 DIAGNOSIS — I97190 Other postprocedural cardiac functional disturbances following cardiac surgery: Secondary | ICD-10-CM | POA: Diagnosis present

## 2010-05-27 DIAGNOSIS — I319 Disease of pericardium, unspecified: Principal | ICD-10-CM | POA: Diagnosis present

## 2010-05-27 DIAGNOSIS — N179 Acute kidney failure, unspecified: Secondary | ICD-10-CM | POA: Diagnosis not present

## 2010-05-27 DIAGNOSIS — Z954 Presence of other heart-valve replacement: Secondary | ICD-10-CM

## 2010-05-27 DIAGNOSIS — I509 Heart failure, unspecified: Secondary | ICD-10-CM

## 2010-05-27 DIAGNOSIS — I312 Hemopericardium, not elsewhere classified: Secondary | ICD-10-CM

## 2010-05-27 DIAGNOSIS — R0602 Shortness of breath: Secondary | ICD-10-CM

## 2010-05-27 DIAGNOSIS — Z7982 Long term (current) use of aspirin: Secondary | ICD-10-CM

## 2010-05-27 DIAGNOSIS — Z951 Presence of aortocoronary bypass graft: Secondary | ICD-10-CM

## 2010-05-27 DIAGNOSIS — N400 Enlarged prostate without lower urinary tract symptoms: Secondary | ICD-10-CM | POA: Diagnosis present

## 2010-05-27 DIAGNOSIS — I314 Cardiac tamponade: Secondary | ICD-10-CM | POA: Diagnosis present

## 2010-05-27 DIAGNOSIS — I5033 Acute on chronic diastolic (congestive) heart failure: Secondary | ICD-10-CM | POA: Diagnosis present

## 2010-05-27 DIAGNOSIS — D62 Acute posthemorrhagic anemia: Secondary | ICD-10-CM | POA: Diagnosis not present

## 2010-05-27 DIAGNOSIS — J90 Pleural effusion, not elsewhere classified: Secondary | ICD-10-CM | POA: Diagnosis present

## 2010-05-27 DIAGNOSIS — E785 Hyperlipidemia, unspecified: Secondary | ICD-10-CM | POA: Diagnosis present

## 2010-05-27 LAB — CBC
MCHC: 32.2 g/dL (ref 30.0–36.0)
RDW: 15.1 % (ref 11.5–15.5)

## 2010-05-27 LAB — PROTIME-INR: Prothrombin Time: 23.1 seconds — ABNORMAL HIGH (ref 11.6–15.2)

## 2010-05-27 LAB — BRAIN NATRIURETIC PEPTIDE: Pro B Natriuretic peptide (BNP): 250 pg/mL — ABNORMAL HIGH (ref 0.0–100.0)

## 2010-05-27 LAB — COMPREHENSIVE METABOLIC PANEL
ALT: 26 U/L (ref 0–53)
AST: 17 U/L (ref 0–37)
Calcium: 8.7 mg/dL (ref 8.4–10.5)
GFR calc Af Amer: 60 mL/min — ABNORMAL LOW (ref >60)
Sodium: 137 mEq/L (ref 135–145)
Total Protein: 6.1 g/dL (ref 6.0–8.3)

## 2010-05-27 NOTE — Discharge Summary (Signed)
  [name], [name]               ACCOUNT NO.:  [id number]  MEDICAL RECORD NO.:  [id number]           PATIENT TYPE:  I  LOCATION:  2031                         FACILITY:  [facility]  PHYSICIAN:  Salvatore Decent. [name], M.D.DATE OF BIRTH:  [birth year]  DATE OF ADMISSION:  05/10/2010 DATE OF DISCHARGE:  05/16/2010                              DISCHARGE SUMMARY   ADDENDUM.  The patient was anticipated for discharge home on 24-48 hours from dictated date.  Over the weekend, the patient had continuous nausea on morning of postop day 5, May 15, 2010.  He was maintaining normal sinus rhythm at this time and amiodarone dose was decreased to 200 mg b.i.d., possibly contributing to the patient's nausea.  The patient was able to have another bowel movement on evening of postop day 5 and nausea improved.  Currently, the patient is tolerating regular diet well.  No further nausea.  He has maintained normal sinus rhythm.  The patient has continued on his lower dose of amiodarone as well as Coumadin and Coreg. The patient's INR level prior to discharge home is 3.1.  His Coumadin was decreased to 2.5 mg at night.  He will get his PT/INR level checked at Dr. Alford Highland office on May 18, 2010.  The patient has continued to ambulate without difficulty.  All incisions remained clean, dry, and intact, and healing well.  His creatinine continued to stabilize and was found to be 1.32 on postop day 5.  The patient still with some mild volume overload and was continued on diuretics.  We will plan to continue diuretics at discharge.  The patient is felt to be stable today, postop day 6, May 16, 2010, for discharge to home.  He is currently afebrile in normal sinus rhythm.  Blood pressure is stable. O2 sats greater than 90% on room air.  He has still not been started on an ACE inhibitor secondary to his acute renal insufficiency postoperatively.  This can be reevaluated as an outpatient.  Most recent lab work  shows sodium of 137, potassium 4.8, chloride of 102, bicarbonate 25, BUN of 32, creatinine 1.32, glucose of 112.  White blood cell count 9.4, hemoglobin 9.1, hematocrit 26.7, platelet count 119,000. INR 3.1.  Please see dictated discharge summary for follow up appointments and discharge instructions.  Discharge medications as dictated with the change of the amiodarone decreased to 200 mg b.i.d. and Coumadin changed to 2.5 mg at night, otherwise as dictated.     Juan Blazing, PA   ______________________________ Salvatore Decent [name], M.D.    KMD/MEDQ  D:  05/16/2010  T:  05/16/2010  Job:  [id number]  cc:   Marca Ancona, MD  Electronically Signed by Cameron Proud PA on 05/18/2010 12:42:16 PM Electronically Signed by Charlett Lango M.D. on 05/27/2010 02:30:35 PM

## 2010-05-27 NOTE — Discharge Summary (Signed)
[name], [name]               ACCOUNT NO.:  [id number]  MEDICAL RECORD NO.:  [id number]           PATIENT TYPE:  LOCATION:                                 FACILITY:  PHYSICIAN:  Salvatore Decent. [name], M.D.DATE OF BIRTH:  1940/03/30  DATE OF ADMISSION: DATE OF DISCHARGE:                              DISCHARGE SUMMARY   FINAL DIAGNOSIS:  Severe aortic stenosis and two-vessel coronary artery disease.  IN-HOSPITAL DIAGNOSES: 1. Acute blood loss anemia postoperatively. 2. Postoperative atrial fibrillation. 3. Volume overload postoperatively. 4. Acute renal insufficiency postoperatively.  SECONDARY DIAGNOSES: 1. Hypertension. 2. Herpes ulcer. 3. Dyslipidemia. 4. Benign prostatic hypertrophy. 5. Status post left shoulder surgery.  IN-HOSPITAL OPERATIONS AND PROCEDURES: 1. Coronary artery bypass grafting x2 using a saphenous vein graft to     first diagonal, saphenous vein graft to obtuse marginal 1.  Aortic     valve replacement with 23-mm  [facility] Ease pericardial valve.     Endoscopic vein harvest from right thigh.  Done by Dr. [name]     on May 10, 2010. 2. Intraoperative transesophageal echocardiogram.  HISTORY AND PHYSICAL AND HOSPITAL COURSE:  The patient is a 70 year old gentleman who presents with recent onset of exertional angina. Echocardiogram revealed progression of the aortic stenosis from moderate to severe.  Cardiac catheterization was done and found to have two- vessel coronary artery disease with lesion in the mid circumflex as well as ostial lesion and the first diagonal.  The patient was referred to Dr. [name].  Dr. [name] saw and evaluated the patient.  He discussed with the patient undergoing coronary artery bypass grafting as well as aortic valve replacement.  We discussed risks and benefits with patient.  Patient voices understanding, and agreed to proceed.  Surgery was scheduled for May 10, 2010.  For further details  of the patient's past medical history and physical exam, please see dictated H and P.  The patient was taken to the operating room on May 10, 2010, where he underwent coronary artery bypass grafting x2 using saphenous vein graft to first diagonal, saphenous vein graft to obtuse marginal one. Aortic valve replacement with 23-mm [facility] Ease pericardial valve.  Endoscopic vein harvest from right thigh.  The patient tolerated this procedure well, and was transferred to the intensive care unit in stable condition.  Postoperatively, the patient was noted to be hemodynamically stable.  He was extubated on the evening of surgery. Post extubation, he was noted to be alert and oriented x4.  Neuro intact.  Postop day #1, the patient was A-paced in the 90s.  Blood pressure stable.  All drips were able to be weaned and discontinued. Chest x-ray obtained was stable.  The patient had minimal drainage from chest tubes and chest tubes were discontinued in routine fashion.  The patient did have hemoglobin and hematocrit of 6.3 and 18.2.  He did receive several units of packed red blood cells.  The patient was started on Lasix for volume overload.  He was up ambulating well.  He did have some mild thrombocytopenia with platelet count 70.  The patient was felt to be stable on  transfer out to PCTU, postop day #1.  On telemetry floor, patient did go into atrial fibrillation.  He was started on IV amiodarone.  The patient was able to convert back to normal sinus rhythm.  His amiodarone was switched over to p.o.  The patient is currently maintaining normal sinus rhythm.  He had been started on Coumadin already for his aortic valve replacement.  Coumadin was continued and dosed based on daily PT/INR levels.  Blood pressure during this time has remained stable.  He has not been restarted on ACE inhibitor secondary to acute renal insufficiency postoperatively.  We will continue to monitor and patient  may be reevaluated as an outpatient to restart ACE inhibitor.  Follow up chest x-ray obtained remained stable.  The patient was encouraged to use his incentive spirometer, has been able to be weaned off oxygen with O2 saturations maintaining greater than 90% on room air.  As stated above, the patient did develop acute renal insufficiency.  His creatinine increased to 1.73 on postop day #3.  This was monitored and started to trend back down by postop day 4 to 1.4.  We will continue to monitor.  The patient had good urinary output.  The patient's hemoglobin and hematocrit continued to be followed.  He did improve following his transfusion to 10.4 and 30.9. He was started on p.o. iron.  We will continue to monitor and recheck his CBC prior to discharge.  He has been up ambulating well with a cardiac rehab.  He is currently tolerating diet well.  He did have some mild nausea on postop day #3, but this did improve following bowel movement.  All incisions were noted to be clean, dry, and intact and healing well.  The patient did have an occult blood sent which was noted to be negative.  On postop day #4, May 14, 2010, vital signs noted to be stable.  He is normal sinus rhythm.  Lab work shows sodium of 135, potassium 4.8, chloride 100, bicarbonate 27, BUN of 33, creatinine 1.4, glucose 139, INR 2.0.  The patient is tentatively ready for discharge to home in the next 48 hours pending, he remained stable.  FOLLOWUP APPOINTMENTS:  A followup appointment has been arranged with Dr. [name] for June 07, 2010, at 11:45 a.m.  The patient need to obtain PA and lateral chest x-ray 30 minutes prior to this appointment. He will need to follow up Dr. Shirlee Latch in 2 weeks.  He will need to contact this office to make these arrangements.  The patient will need to have PT/INR level done on May 18, 2010, at Dr. Alford Highland office.  He will need to contact his office to make these arrangements.  ACTIVITY:   Patient instructed no driving to do so, no lifting over 10 pounds.  He is told to ambulate 3-4 times per day, progress as tolerated and continue his breathing exercises.  INCISIONAL CARE:  The patient is told to shower, washing his incisions using soap and water.  He is to contact the office if he develops any drainage or opening from any of his incision sites.  DIET:  The patient is educated on diet, to be low-fat, low-salt.  DISCHARGE MEDICATIONS: 1. Amiodarone 400 mg b.i.d. x14 days. 2. Amiodarone 200 mg b.i.d. 3. Lasix 40 mg daily x7 days. 4. Nu-Iron 150 mg daily. 5. OxyContin 5 mg 1-2 tabs q.3 hours p.r.n. pain. 6. Potassium chloride 10 mEq daily x7 days. 7. Crestor 10 mg daily. 8. Coumadin 5 mg  at night. 9. Coreg 3.125 mg b.i.d. 10.Enteric-coated aspirin 81 mg daily. 11.Finasteride 5 mg daily. 12.Fish oil 1200 mg daily. 13.Multivitamin daily. 14.Xanax 0.25 mg daily p.r.n.     Sol Blazing, PA   ______________________________ Salvatore Decent [name], M.D.    KMD/MEDQ  D:  05/14/2010  T:  05/15/2010  Job:  [id number]  Electronically Signed by Cameron Proud PA on 05/18/2010 12:42:25 PM Electronically Signed by Charlett Lango M.D. on 05/27/2010 02:30:39 PM

## 2010-05-28 ENCOUNTER — Inpatient Hospital Stay (HOSPITAL_COMMUNITY): Payer: Medicare Other

## 2010-05-28 ENCOUNTER — Other Ambulatory Visit: Payer: Self-pay | Admitting: Thoracic Surgery (Cardiothoracic Vascular Surgery)

## 2010-05-28 DIAGNOSIS — I309 Acute pericarditis, unspecified: Secondary | ICD-10-CM

## 2010-05-28 DIAGNOSIS — I509 Heart failure, unspecified: Secondary | ICD-10-CM

## 2010-05-28 LAB — BASIC METABOLIC PANEL
Chloride: 102 mEq/L (ref 96–112)
Creatinine, Ser: 1.51 mg/dL — ABNORMAL HIGH (ref 0.4–1.5)
GFR calc Af Amer: 56 mL/min — ABNORMAL LOW (ref >60)
GFR calc non Af Amer: 46 mL/min — ABNORMAL LOW (ref >60)
Potassium: 3.8 mEq/L (ref 3.5–5.1)

## 2010-05-28 LAB — BRAIN NATRIURETIC PEPTIDE: Pro B Natriuretic peptide (BNP): 233 pg/mL — ABNORMAL HIGH (ref 0.0–100.0)

## 2010-05-28 LAB — TYPE AND SCREEN
ABO/RH(D): A POS
Antibody Screen: NEGATIVE

## 2010-05-28 NOTE — Assessment & Plan Note (Signed)
OFFICE VISIT  BALDWIN, RACICOT DOB:  May 12, 1940                                        May 27, 2010 CHART #:  [id number]  The patient is a 70 year old gentleman who recently had aortic valve replacement and coronary bypass grafting x2.  A 23-mm Magna Ease valve was used.  Postoperatively, he had some volume overloaded, transient renal insufficiency, and atrial fibrillation.  All of those issues have markedly improved to resolve by the time he was discharged home.  Over the weekend, 5-6 days ago, he began having some problems with shortness of breath and swelling in his legs.  He was seen in the office on Monday, felt to be in volume overload, but no other issues were noted. The patient's Lasix was increased from 40 mg daily to 40 mg twice daily and was scheduled see back in the office next week.  However, over the last couple of days, he has had continuation of his symptoms, if not improved, may be have worsened a little bit.  He is not having any chest pain.  He is having some difficulty sleeping because he cannot lay flat. His appetite is poor, but the primary issue is shortness of breath and orthopnea, some wheezing, and swelling in his legs.  PHYSICAL EXAMINATION:  General:  The patient is a 70 year old gentleman in no acute distress.  Vital Signs:  Blood pressure is 134/87, pulse 92, respirations 20, oxygen saturation is 97% on room air.  Lungs:  He has markedly diminished breath sounds at both bases.  Cardiac:  Regular rate and rhythm.  Normal S1 and S2.  I cannot hear a murmur.  He has sternal incision that is clean, dry, intact.  Sternum is stable.  Extremities: He has 2+ edema in both legs below the knee.  Chest x-ray shows moderate bilateral pleural effusions.  IMPRESSION:  The patient is a 70 year old gentleman status post aortic valve replacement, coronary artery bypass grafting.  He was treated with Coumadin and remains on Coumadin.  He now  has congestive heart failure. He is volume overloaded, not responding to moderate dose diuretics.  He needs hospital admission for treatment of congestive heart failure.  I think this likely is acute diastolic failure in the setting of left ventricular hypertrophy with aortic stenosis, but we cannot rule out cardiac tamponade.  He needs laboratories checked including BMET, CBC, and coagulation as well as a BNP.  We will also do a 2-D echocardiogram and a 12-lead EKG on him, the echocardiogram to rule out tamponade and also assess the prosthetic valve as well as overall left ventricular function.  I will notify Dr. Shirlee Latch of the patient's admission.  Salvatore Decent [name], M.D. Electronically Signed  SCH/MEDQ  D:  05/27/2010  T:  05/28/2010  Job:  [id number]  cc:   Marca Ancona, MD Dwana Curd. Para March, M.D.

## 2010-05-29 ENCOUNTER — Inpatient Hospital Stay (HOSPITAL_COMMUNITY): Payer: Medicare Other

## 2010-05-29 DIAGNOSIS — I319 Disease of pericardium, unspecified: Secondary | ICD-10-CM

## 2010-05-29 LAB — MRSA PCR SCREENING: MRSA by PCR: NEGATIVE

## 2010-05-29 LAB — BASIC METABOLIC PANEL
BUN: 36 mg/dL — ABNORMAL HIGH (ref 6–23)
CO2: 26 mEq/L (ref 19–32)
Calcium: 8.3 mg/dL — ABNORMAL LOW (ref 8.4–10.5)
Calcium: 8.2 mg/dL — ABNORMAL LOW (ref 8.4–10.5)
Chloride: 104 mEq/L (ref 96–112)
Creatinine, Ser: 1.55 mg/dL — ABNORMAL HIGH (ref 0.4–1.5)
Creatinine, Ser: 1.68 mg/dL — ABNORMAL HIGH (ref 0.4–1.5)
GFR calc Af Amer: 49 mL/min — ABNORMAL LOW (ref >60)
GFR calc non Af Amer: 45 mL/min — ABNORMAL LOW (ref >60)
GFR calc non Af Amer: 41 mL/min — ABNORMAL LOW (ref >60)
Glucose, Bld: 162 mg/dL — ABNORMAL HIGH (ref 70–99)
Glucose, Bld: 164 mg/dL — ABNORMAL HIGH (ref 70–99)
Potassium: 4.8 mEq/L (ref 3.5–5.1)
Sodium: 137 mEq/L (ref 135–145)
Sodium: 136 mEq/L (ref 135–145)

## 2010-05-29 LAB — PROTIME-INR: Prothrombin Time: 18.6 seconds — ABNORMAL HIGH (ref 11.6–15.2)

## 2010-05-29 LAB — CBC
MCH: 28.7 pg (ref 26.0–34.0)
Platelets: 193 10*3/uL (ref 150–400)
RBC: 3.76 MIL/uL — ABNORMAL LOW (ref 4.22–5.81)
WBC: 10.4 10*3/uL (ref 4.0–10.5)

## 2010-05-30 ENCOUNTER — Inpatient Hospital Stay (HOSPITAL_COMMUNITY): Payer: Medicare Other

## 2010-05-30 DIAGNOSIS — I2510 Atherosclerotic heart disease of native coronary artery without angina pectoris: Secondary | ICD-10-CM

## 2010-05-30 LAB — COMPREHENSIVE METABOLIC PANEL
Alkaline Phosphatase: 59 U/L (ref 39–117)
BUN: 41 mg/dL — ABNORMAL HIGH (ref 6–23)
CO2: 26 mEq/L (ref 19–32)
Chloride: 107 mEq/L (ref 96–112)
Creatinine, Ser: 1.65 mg/dL — ABNORMAL HIGH (ref 0.4–1.5)
GFR calc non Af Amer: 42 mL/min — ABNORMAL LOW (ref >60)
Glucose, Bld: 145 mg/dL — ABNORMAL HIGH (ref 70–99)
Potassium: 5.2 mEq/L — ABNORMAL HIGH (ref 3.5–5.1)
Total Bilirubin: 0.3 mg/dL (ref 0.3–1.2)

## 2010-05-30 LAB — CBC
HCT: 32.3 % — ABNORMAL LOW (ref 39.0–52.0)
Hemoglobin: 10.2 g/dL — ABNORMAL LOW (ref 13.0–17.0)
MCH: 29.5 pg (ref 26.0–34.0)
MCV: 93.4 fL (ref 78.0–100.0)
RBC: 3.46 MIL/uL — ABNORMAL LOW (ref 4.22–5.81)

## 2010-05-31 ENCOUNTER — Inpatient Hospital Stay (HOSPITAL_COMMUNITY): Payer: Medicare Other

## 2010-05-31 ENCOUNTER — Encounter: Payer: Self-pay | Admitting: Thoracic Surgery (Cardiothoracic Vascular Surgery)

## 2010-05-31 DIAGNOSIS — I5033 Acute on chronic diastolic (congestive) heart failure: Secondary | ICD-10-CM

## 2010-05-31 LAB — BASIC METABOLIC PANEL
BUN: 43 mg/dL — ABNORMAL HIGH (ref 6–23)
CO2: 25 mEq/L (ref 19–32)
Calcium: 7.7 mg/dL — ABNORMAL LOW (ref 8.4–10.5)
Chloride: 104 mEq/L (ref 96–112)
Creatinine, Ser: 1.52 mg/dL — ABNORMAL HIGH (ref 0.4–1.5)
GFR calc Af Amer: 55 mL/min — ABNORMAL LOW (ref >60)
GFR calc non Af Amer: 46 mL/min — ABNORMAL LOW (ref >60)
Glucose, Bld: 156 mg/dL — ABNORMAL HIGH (ref 70–99)
Potassium: 5.5 mEq/L — ABNORMAL HIGH (ref 3.5–5.1)
Sodium: 134 mEq/L — ABNORMAL LOW (ref 135–145)

## 2010-05-31 LAB — CBC
HCT: 31.3 % — ABNORMAL LOW (ref 39.0–52.0)
Hemoglobin: 10.2 g/dL — ABNORMAL LOW (ref 13.0–17.0)
MCH: 30.2 pg (ref 26.0–34.0)
MCHC: 32.6 g/dL (ref 30.0–36.0)
MCV: 92.6 fL (ref 78.0–100.0)
Platelets: 163 10*3/uL (ref 150–400)
RBC: 3.38 MIL/uL — ABNORMAL LOW (ref 4.22–5.81)
RDW: 14.7 % (ref 11.5–15.5)
WBC: 12.6 10*3/uL — ABNORMAL HIGH (ref 4.0–10.5)

## 2010-06-01 ENCOUNTER — Inpatient Hospital Stay (HOSPITAL_COMMUNITY): Payer: Medicare Other

## 2010-06-01 LAB — BASIC METABOLIC PANEL
CO2: 27 mEq/L (ref 19–32)
Calcium: 7.8 mg/dL — ABNORMAL LOW (ref 8.4–10.5)
Creatinine, Ser: 1.35 mg/dL (ref 0.4–1.5)
GFR calc Af Amer: >60 mL/min (ref >60)
Sodium: 137 mEq/L (ref 135–145)

## 2010-06-01 LAB — BODY FLUID CULTURE

## 2010-06-01 NOTE — Medication Information (Signed)
Summary: rov/pc  Anticoagulant Therapy  Managed by: Cloyde Reams, RN, BSN Referring MD: Dr Shirlee Latch PCP: Crawford Givens MD Supervising MD: Clifton James MD, Cristal Deer Indication 1: aortic Valve Replacement Valve Position: Aortic Lab Used: [facility]  Site: [address] INR POC 2.1 INR RANGE 2.0-3.0  Dietary changes: no    Health status changes: no    Bleeding/hemorrhagic complications: no    Recent/future hospitalizations: no    Any changes in medication regimen? yes       Details: Resumed Lasix 40mg  bid, incr Klor-con bid  Recent/future dental: no  Any missed doses?: no       Is patient compliant with meds? yes       Allergies: 1)  ! Paxil  Anticoagulation Management History:      The patient is taking warfarin and comes in today for a routine follow up visit.  Positive risk factors for bleeding include an age of 69 years or older.  The bleeding index is 'intermediate risk'.  Positive CHADS2 values include History of HTN.  Negative CHADS2 values include Age > 95 years old.  His last INR was 1.0 ratio.  Anticoagulation responsible provider: Clifton James MD, Cristal Deer.  INR POC: 2.1.  Cuvette Lot#: [id number].  Exp: 04/2011.    Anticoagulation Management Assessment/Plan:      The target INR is 2.0-3.0.  The next INR is due 06/01/2010.  Results were reviewed/authorized by Cloyde Reams, RN, BSN.  He was notified by Cloyde Reams RN.         Prior Anticoagulation Instructions: INR 3.3 Skip today's dose.  Then resume taking 1 tablet every day. Recheck in 1 week.  Current Anticoagulation Instructions: INR 2.1  Continue on same dosage 1 tablet daily.  Recheck in 1 weeks.

## 2010-06-01 NOTE — Progress Notes (Signed)
Summary: Edema   Phone Note Call from Patient Call back at Home Phone [phone]   Caller: Self  Call For: Shirlee Latch Summary of Call: Pt is swelling even though he is using Lasix 40 mg 2 x a day.  SOB and exhaustion on exertion.  Sleeping in a chair sitting up.  Has been going on since the pt was in the hospital.  D/C on 05/16/10.  Symptoms have worsened since coming home. Initial call taken by: Harlon Flor,  May 27, 2010 10:36 AM  Follow-up for Phone Call        Spoke to pt's wife, states pt had cardiac bypass and aortic valve surgery 05/10/10, and has been having SOB and edema since then. Pt saw PA at Dr. Sunday Corn office Monday this week and CXR and labs done that showed fluid overload, pt is on lasix 40mg  two times a day. Pt SOB has incr and he is "feeling worse." Pt was instructed to contact Dr. Sunday Corn office 2 days after visit if not better.  Spoke to [place], Charity fundraiser at Dr. Sunday Corn, she has scheduled an appt for pt today at 1pm, pt's wife notified. Told pt he will need f/u with [place] soon, they will call to schedule.     Appended Document: Edema admitted to [facility]

## 2010-06-02 ENCOUNTER — Inpatient Hospital Stay (HOSPITAL_COMMUNITY): Payer: Medicare Other

## 2010-06-02 LAB — BASIC METABOLIC PANEL
Calcium: 7.8 mg/dL — ABNORMAL LOW (ref 8.4–10.5)
GFR calc Af Amer: 56 mL/min — ABNORMAL LOW (ref >60)
GFR calc non Af Amer: 46 mL/min — ABNORMAL LOW (ref >60)
Glucose, Bld: 113 mg/dL — ABNORMAL HIGH (ref 70–99)
Potassium: 4.7 mEq/L (ref 3.5–5.1)
Sodium: 137 mEq/L (ref 135–145)

## 2010-06-04 NOTE — Op Note (Signed)
  [name], [name]               ACCOUNT NO.:  [id number]  MEDICAL RECORD NO.:  [id number]           PATIENT TYPE:  I  LOCATION:  2315                         FACILITY:  [facility]  PHYSICIAN:  Janetta Hora. Migel Hannis, M.D.DATE OF BIRTH:  October 18, 1940  DATE OF PROCEDURE: DATE OF DISCHARGE:                              OPERATIVE REPORT   I was consulted by Dr. Charlett Lango to provide intraoperative anesthesia and transesophageal echocardiography services for his patient Mr. Aken.  Mr. Bautch was met in the holding area at [facility], and preoperatively prepared with invasive monitors and informed consent was obtained by myself and Dr. Jairo Ben.  After successfully placing these monitors, the patient was brought to the operating room where the remainder monitors were placed and the patient underwent an stable and successful induction of general anesthesia.  His airway was secured with an endotracheal tube.  His vital signs were stable throughout.  Transesophageal echocardiography probe was prepared by the echocardiography tech and placed easily to the depth of 40 cm and the following views were obtained.  The short axis trans ventricular view of the heart revealed a slightly depressed right ventricle, however, the right and left ventricle contraction was good with ejection fraction estimated to be 50%.  It was easily apparent that there was an organized pericardial effusion it is circumferentially around the heart.  The right-sided structures of the right atrium and right ventricle showed indentations consistent with this organized pericardial effusion.  Exam of the cardiac structures of the heart were recorded for review. The tricuspid valve was in normal appearance and had a trace of regurgitation.  There was no interatrial septal defect noted.  The mitral valve was normal in appearance and function except for a trace of mitral regurgitation.  The left atrial appendage was  clear.  The aortic valve prosthesis previously placed was noted and it was functioning well.  There was no perivalvular leaks.  There was no stenotic areas related to the prosthetic valve.  They are normal turbulent pattern consistent with the prosthetic valvular appearance.  The probe was left in place for monitoring throughout the procedure and removed at the completion of the operative procedure.          ______________________________ Janetta Hora. Gelene Mink, M.D.     CEF/MEDQ  D:  05/28/2010  T:  05/29/2010  Job:  [id number]  Electronically Signed by Hart Robinsons M.D. on 06/04/2010 02:33:47 PM

## 2010-06-07 ENCOUNTER — Encounter: Payer: Self-pay | Admitting: Family Medicine

## 2010-06-07 ENCOUNTER — Encounter: Payer: Medicare Other | Admitting: Thoracic Surgery (Cardiothoracic Vascular Surgery)

## 2010-06-07 DIAGNOSIS — I2510 Atherosclerotic heart disease of native coronary artery without angina pectoris: Secondary | ICD-10-CM | POA: Insufficient documentation

## 2010-06-07 HISTORY — DX: Atherosclerotic heart disease of native coronary artery without angina pectoris: I25.10

## 2010-06-11 ENCOUNTER — Other Ambulatory Visit: Payer: Self-pay | Admitting: Thoracic Surgery (Cardiothoracic Vascular Surgery)

## 2010-06-11 DIAGNOSIS — I2510 Atherosclerotic heart disease of native coronary artery without angina pectoris: Secondary | ICD-10-CM

## 2010-06-11 DIAGNOSIS — I359 Nonrheumatic aortic valve disorder, unspecified: Secondary | ICD-10-CM

## 2010-06-14 ENCOUNTER — Ambulatory Visit
Admission: RE | Admit: 2010-06-14 | Discharge: 2010-06-14 | Disposition: A | Discharge status: Home / Self Care | Payer: Medicare Other | Source: Ambulatory Visit | Attending: Thoracic Surgery (Cardiothoracic Vascular Surgery) | Admitting: Thoracic Surgery (Cardiothoracic Vascular Surgery)

## 2010-06-14 ENCOUNTER — Encounter (INDEPENDENT_AMBULATORY_CARE_PROVIDER_SITE_OTHER): Payer: Self-pay | Admitting: Thoracic Surgery (Cardiothoracic Vascular Surgery)

## 2010-06-14 DIAGNOSIS — I2510 Atherosclerotic heart disease of native coronary artery without angina pectoris: Secondary | ICD-10-CM

## 2010-06-14 DIAGNOSIS — I359 Nonrheumatic aortic valve disorder, unspecified: Secondary | ICD-10-CM

## 2010-06-14 DIAGNOSIS — I309 Acute pericarditis, unspecified: Secondary | ICD-10-CM

## 2010-06-15 NOTE — Assessment & Plan Note (Signed)
OFFICE VISIT  JERNARD, REIBER DOB:  1940/09/17                                        June 14, 2010 CHART #:  [id number]  REASON FOR VISIT:  Follow up after recent aortic valve replacement and pericardial window.  HISTORY OF PRESENT ILLNESS:  Mr. Allred is a 70 year old gentleman who underwent aortic valve replacement with a pericardial valve and coronary artery bypass grafting x2 on May 10, 2010.  Really did not well with that initial procedure, was discharged home.  He did have some atrial fibrillation.  He was treated with Coumadin and amiodarone.  He was discharged home in sinus rhythm.  He came back with complaints of severe shortness of breath and peripheral edema.  An echocardiogram showed a large pericardial effusion and he was taken to the operating room on May 28 2010, a subxiphoid pericardial window.  That time, bilateral chest tubes were placed.  He had about 150 mL of fluid in his pericardium.  He had 1.5 liters in the right chest and 1.9 liters in the left chest.  He diuresed well after the pericardial window.  He says that since he has been home, he has been doing very well.  He has mild discomfort, but is not taking any narcotics.  He remains on the amiodarone at 200 mg b.i.d.  He is anxious to increase his activities and anxious to go to [facility].  CURRENT MEDICATIONS: 1. Finasteride 5 mg daily. 2. Alprazolam 0.25 mg nightly. 3. Aspirin 81 mg daily. 4. Fish oil 1200 mg daily. 5. Coreg 3.125 mg b.i.d. 6. Crestor 10 mg daily. 7. Amiodarone 200 mg daily. 8. Lasix 40 mg b.i.d., today was his last dose, he took his last pill     this morning.  PHYSICAL EXAMINATION:  General:  Mr. Allum is a well-appearing 70 year old gentleman in no acute distress.  Vital Signs:  His blood pressure is 133/77, pulse 99, respirations are 20, oxygen saturation 97% on room air.  Lungs:  Clear with equal breath sounds bilaterally.  Cardiac:   Has regular rate and rhythm.  Normal S1 and S2.  There is no rubs or murmurs.  Sternum is stable.  Sternal incision is clean, dry and intact. He does have seroma at his right knee.  There is no erythema, induration or tenderness.  Chest x-ray shows good aeration of the lungs bilaterally with no pleural effusions.  IMPRESSION:  Mr. Liberto is a 70 year old gentleman status post aortic valve replacement and coronary artery bypass grafting x2.  He is now about a month out from that surgery.  This was complicated by atrial fibrillation.  He was anticoagulated for that and developed a pericardial effusion with heart failure symptoms related to cardiac tamponade.  His heart failure symptoms resolved immediately after drainage of fluid.  He has done well since that time.  I recommended him that we continue the amiodarone for about two more weeks.  He is going to see Dr. Shirlee Latch on Thursday and they can further discuss that at that time.  He may begin driving.  Appropriate precautions were discussed. He is not to lift any objects greater than 10 pounds for another 2 weeks and nothing over 20 pounds for 4 weeks.  Other than that his activities are unrestricted.  I did advise him that if he was going to travel  to [facility] that he should stop the car and get out and walk around for least 15 minutes every hour and a half to two hours during the trip. From a surgical standpoint, Mr. Evetts is doing well at this point in time and I do not think there is anything that needs to be done about the seroma in his right leg unless that was become painful or developed any sign of infection.  I would be happy to see him back anytime if I could be of any assistance with his care.  Salvatore Decent [name], M.D. Electronically Signed  SCH/MEDQ  D:  06/14/2010  T:  06/15/2010  Job:  [id number]  cc:   Marca Ancona, MD Dwana Curd. Para March, M.D.

## 2010-06-16 ENCOUNTER — Encounter: Payer: Self-pay | Admitting: Cardiology

## 2010-06-16 NOTE — Discharge Summary (Signed)
NAMEXZANDER, GILHAM               ACCOUNT NO.:  [id number]  MEDICAL RECORD NO.:  [id number]           PATIENT TYPE:  I  LOCATION:  2019                         FACILITY:  [facility]  PHYSICIAN:  Salvatore Decent. [name], M.D.DATE OF BIRTH:  1940/04/23  DATE OF ADMISSION:  05/27/2010 DATE OF DISCHARGE:  06/02/2010                              DISCHARGE SUMMARY   FINAL DIAGNOSIS:  Pericardial effusion with pericardial tamponade and large bilateral pleural effusion, status post coronary artery bypass grafting x2 and aortic valve replacement with a 23-mm [facility] Ease pericardial valve on May 10, 2010.  IN-HOSPITAL DIAGNOSES: 1. Acute diastolic heart failure secondary to pericardial     effusion/tamponade. 2. Postpericardiotomy syndrome. 3. Acute blood loss anemia postoperatively. 4. Volume overload.  SECONDARY DIAGNOSES: 1. Severe aortic stenosis and two-vessel coronary artery disease,     status post coronary artery bypass grafting x2 using a saphenous     vein graft to first diagonal, saphenous vein graft to obtuse     marginal 1.  Aortic valve replacement with 23-mm [facility] Ease     pericardial valve.  Endoscopic vein harvest of the right thigh.     This was done by Dr. [name] on May 10, 2010. 2. Hypertension. 3. Dyslipidemia. 4. Benign prostatic hypertrophy. 5. Status post left shoulder surgery. 6. 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 7. Postoperative acute renal insufficiency following coronary artery     bypass graft/aortic valve replacement. 8. Postoperative atrial fibrillation following coronary artery bypass     graft/aortic valve replacement.  IN-HOSPITAL OPERATIONS AND PROCEDURES: 1. Intraoperative transesophageal echocardiogram. 2. Two subxiphoid pericardial window with bilateral chest tube     placement done by Dr. [name] on May 28, 2010.  HISTORY AND PHYSICAL AND HOSPITAL COURSE:  The patient is a 70 year old gentleman who  recently underwent coronary artery bypass grafting x2 with aortic valve replacement using a 23 Magna Ease valve done by Dr. [name] on May 10, 2010.  Postoperatively, the patient did develop volume overload as well as transient renal insufficiency and atrial fibrillation.  All these postoperative complications resolved and the patient was discharged to home in stable condition.  He presented to the office on Monday, May 24, 2010, with complaints of increasing swelling and shortness of breath.  Chest x-ray obtained, did show bilateral pleural effusions.  The patient had been started on Coumadin postoperatively.  The patient's symptoms did not improve and he contacted the office several days later and was seen by Dr. [name]. At that time, Dr. [name] felt that the patient was in congestive heart failure with significant volume overload and not responding to moderate diuretics and needed further evaluation and management and required admission to [facility].  This was discussed with the patient.  For further details of the patient's past medical history and physical exam, please see dictated H and P.  Mr. Dolinger was admitted to [facility] on May 27, 2010, with congestive heart failure with moderate volume overload.  A 2-D echocardiogram was ordered and this showed a localized pericardial effusion adjacent to the RV free wall.  Measured about 1.6  cm in diameter in diastole.  There was noted to be trivial pericardial effusion around the remainder of the heart.  There was significant bilateral pleural effusions.  The patient's aortic valve was stable with no significant regurgitation.  BNP ordered on May 27, 2010 was 250. His INR level was 2.03.  The patient's Coumadin was placed on hold and he was placed n.p.o.  Dr. [name] discussed with the patient, undergoing pericardial window with drainage of bilateral pleural effusions and placement of  bilateral chest tubes.  This was scheduled for May 28, 2010.  Risks and benefits were discussed with the patient. He acknowledges understanding and agreed to proceed.  Dr. Shirlee Latch was consulted and saw and evaluated the patient prior to him being taken to the operating room.  The patient was taken to the operating room on May 28, 2010, where he underwent subxiphoid pericardial window with bilateral chest tubes placement.  The patient tolerated the procedure well and transferred to the intensive care unit in stable condition.  He was able to be extubated following surgery.  Postextubation, the patient noted to be alert and oriented x4.  Neuro intact.  He is noted to be hemodynamically stable.  Postoperatively, the patient was maintaining normal sinus rhythm.  His blood pressure was stable.  His creatinine noted to be elevated to 1.55, postop day #1.  His ACE inhibitor was placed on hold secondary to acute renal insufficiency.  He was continued on Coreg.  The patient was also continued on p.o. amiodarone.  The patient has remained in normal sinus rhythm during his postoperative course and blood pressure has been well controlled on Coreg.  His creatinine remained stable at about 1.5.  Postoperatively, it was felt that the patient had a postpericardiotomy syndrome.  He was started on IV Solu-Medrol.  This was continued for several days and was switched over to a prednisone tapering dose.  The patient was continued on diuretics for a significant volume overload.  Daily weights were followed closely and this was slowly improving.  He still has significant edema prior to discharge home and plan to send home on p.o. Lasix.  Chest x-rays were followed postoperatively, they have remained stable.  The patient's chest tube drainage was monitored and noted to have minimal drainage and chest tubes were able to be discontinued on postop day #3.  Followup chest x- ray remained stable with no  pneumothorax noted or pleural effusions. The patient was encouraged to use his incentive spirometer and was able to be weaned off oxygen with O2 saturations maintaining greater than 90% on room air.  The patient was transferred out of the SICU to 2000 on postop day #3.  He continued to progress well.  The patient has remained afebrile.  He had been up ambulating well with assistance.  He is tolerating diet well.  No nausea or vomiting noted.  All incisions are clean, dry and intact and healing well.  Coumadin was not restarted postoperatively due to the patient maintaining normal sinus rhythm.  His amiodarone dose was decreased to 200 mg daily prior to discharge to home.  On morning rounds of June 02, 2010, the patient was felt to be stable and ready for discharge to home.  His most recent lab work shows sodium of 137, potassium 4.7, chloride 104, bicarbonate 29, BUN of 34, creatinine 1.51, glucose 113.  White blood cell count of 12.6, hemoglobin of 10.2, hematocrit 31.3, platelet count 163.  FOLLOWUP APPOINTMENTS:  A followup appointment has arranged with  Dr. [name] for June 14, 2010 at 2:45 p.m.  The patient will need to obtain PA and lateral chest x-ray 45 minutes prior to this appointment. The patient will need to follow up with Dr. Shirlee Latch in 10 days.  He will need to contact his office to make these arrangements.  Dr. Shirlee Latch wants the patient to obtain a BMP in 5 days.  These results will be faxed to both Dr. Alford Highland office as well as Dr. [name].  ACTIVITY:  The patient is instructed no driving until released to do so. No heavy lifting over 10 pounds.  He is told to ambulate 3-4 times per day, progress as tolerated and to continue his breathing exercises.  INCISIONAL CARE:  The patient is told to shower, washing his incisions using soap and water.  He is to contact the office if he develops any drainage or opening from any of his incision sites.  DIET:  The patient is  educated on diet to be low fat and low salt.  DISCHARGE MEDICATIONS: 1. Percocet 5/325 one to two tablets q.4 h. p.r.n. pain. 2. Prednisone 14 mg on June 03, 2010, then prednisone 30 mg x2 days,     then prednisone 20 mg x2 days, then prednisone 10 mg x2 days, then     discontinue. 3. Amiodarone 200 mg daily. 4. Lasix 40 mg b.i.d. 5. Enteric-coated aspirin 81 mg daily. 6. Coreg 3.125 mg daily. 7. Crestor 10 mg daily. 8. Proscar 5 mg daily. 9. Xanax 0.25 mg half a tablet to one tablet at night p.r.n.     Sol Blazing, PA   ______________________________ Salvatore Decent [name], M.D.    KMD/MEDQ  D:  06/02/2010  T:  06/03/2010  Job:  [id number]  cc:   Marca Ancona, MD  Electronically Signed by Cameron Proud PA on 06/03/2010 14:78:29 PM Electronically Signed by Charlett Lango M.D. on 06/16/2010 11:53:11 AM

## 2010-06-16 NOTE — Op Note (Signed)
NAMEFAOLAN, SPRINGFIELD               ACCOUNT NO.:  [id number]  MEDICAL RECORD NO.:  [id number]           PATIENT TYPE:  I  LOCATION:  2019                         FACILITY:  [facility]  PHYSICIAN:  Salvatore Decent. [name], M.D.DATE OF BIRTH:  27-Sep-1940  DATE OF PROCEDURE: DATE OF DISCHARGE:                              OPERATIVE REPORT   PREOPERATIVE DIAGNOSES:  Pericardial effusion, possible pericardial tamponade, large bilateral pleural effusions.  POSTOPERATIVE DIAGNOSES:  Pericardial effusion, possible pericardial tamponade, large bilateral pleural effusions.  PROCEDURES:  Subxiphoid pericardial window, bilateral chest tube placement.  SURGEON:  Salvatore Decent. [name], MD  ASSISTANT:  Rowe Clack, PA-C  ANESTHESIA:  General.  FINDINGS:  Approximately 150 mL of bloody fluid within the pericardium, improved wall motion of the ventricular septum after drainage of the fusion, 1.5 L of bloody pleural fluid evacuated from right chest, 1.9 L evacuated from left chest.  CLINICAL NOTE:  Mr. Vanderheiden is a 70 year old gentleman who recently had aortic valve replacement, coronary artery bypass grafting.  He presented back with congestive heart failure.  Echocardiogram revealed good function of the prosthetic valve with no perivalvular leaks.  Overall left ventricular function was preserved but there was some paradoxical motion of the septum and a small localized pericardial effusion overlying the right ventricle leading to concern for possible early tamponade.  Therapeutic options were discussed with the patient including subxiphoid window to drain the pericardial effusion and bilateral chest tube placement to drain the pleural effusions.  The indications, risks, benefits and alternatives were discussed in detail with the patient.  He understood there was no guarantee that the pericardial effusion was in fact causing tamponade.  He understood this and agreed to proceed.  OPERATIVE NOTE:   Mr. Coor was brought to the operating room on May 28, 2010, via the Anesthesia Service placed an arterial blood pressure monitoring line.  Intravenous antibiotics were administered.  The patient was anesthetized and intubated.  The chest was prepped and draped in usual sterile fashion.  Transesophageal echocardiography was performed that showed a small pericardial effusion.  There was some paradoxical motion of the ventricular septum.  There was no significant valvular pathology.  An incision was made beginning at the inferior aspect of the patient's previous sternotomy and extended downward several centimeters.  The incision was carried down to the xiphoid process.  The xiphoid process was excised.  Traction was placed on the underside of the sternum.  The pericardium was exposed and incised. There was a nonclotting bloody pericardial effusion.  A small window was made in the pericardium.  There was tense inflammatory reaction on the epicardial surface.  A sucker suction tip was gently probed around the pericardium while under visualization with echocardiogram to ensure that all the effusion had been drained and there were not any loculated sites remaining.  Of note, there was improvement of the septal wall motion after drainage of the pericardial effusion.  Additional incisions were made just above the costal margin on both sides bilaterally on the right and left.  Incision was carried through the skin and subcutaneous tissue.  Hemostasis was achieved with electrocautery on both  sides in sequence.  The chest was entered bluntly using a hemostat on both occasions.  There was bloody fluid, 1.5 L was evacuated from the right chest and 1.9 L from the left chest.  A 28-French Blake drains were then placed in each pleural space through the separate incision that was made and a 28-French Blake drain was placed in the pericardium.  All were secured with #1 silk sutures.  The midline incision  then was closed with a #1 Vicryl fascial suture followed by 2-0 Vicryl subcutaneous suture and a 3-0 Vicryl subcuticular suture.  All sponge, needle and instrument counts were correct at the end of the procedure.  The patient was taken from the operating room to the recovery room, extubated in good condition.     Salvatore Decent [name], M.D.     SCH/MEDQ  D:  05/31/2010  T:  06/01/2010  Job:  [id number]  cc:   Marca Ancona, MD  Electronically Signed by Charlett Lango M.D. on 06/16/2010 11:53:25 AM

## 2010-06-17 ENCOUNTER — Telehealth: Payer: Self-pay | Admitting: *Deleted

## 2010-06-17 ENCOUNTER — Ambulatory Visit (HOSPITAL_COMMUNITY): Payer: Medicare Other | Attending: Family Medicine | Admitting: Radiology

## 2010-06-17 ENCOUNTER — Ambulatory Visit (INDEPENDENT_AMBULATORY_CARE_PROVIDER_SITE_OTHER)
Admission: RE | Admit: 2010-06-17 | Discharge: 2010-06-17 | Disposition: A | Discharge status: Home / Self Care | Payer: Medicare Other | Source: Ambulatory Visit | Attending: Cardiology | Admitting: Cardiology

## 2010-06-17 ENCOUNTER — Other Ambulatory Visit: Payer: Medicare Other

## 2010-06-17 ENCOUNTER — Ambulatory Visit (INDEPENDENT_AMBULATORY_CARE_PROVIDER_SITE_OTHER): Payer: Medicare Other | Admitting: Cardiology

## 2010-06-17 ENCOUNTER — Other Ambulatory Visit (INDEPENDENT_AMBULATORY_CARE_PROVIDER_SITE_OTHER): Payer: Medicare Other

## 2010-06-17 ENCOUNTER — Ambulatory Visit (HOSPITAL_COMMUNITY)
Admission: RE | Admit: 2010-06-17 | Discharge: 2010-06-17 | Disposition: A | Discharge status: Home / Self Care | Payer: Medicare Other | Source: Ambulatory Visit | Attending: Cardiology | Admitting: Cardiology

## 2010-06-17 ENCOUNTER — Encounter: Payer: Self-pay | Admitting: Cardiology

## 2010-06-17 DIAGNOSIS — I2510 Atherosclerotic heart disease of native coronary artery without angina pectoris: Secondary | ICD-10-CM

## 2010-06-17 DIAGNOSIS — I5032 Chronic diastolic (congestive) heart failure: Secondary | ICD-10-CM

## 2010-06-17 DIAGNOSIS — R748 Abnormal levels of other serum enzymes: Secondary | ICD-10-CM

## 2010-06-17 DIAGNOSIS — R0609 Other forms of dyspnea: Secondary | ICD-10-CM

## 2010-06-17 DIAGNOSIS — I359 Nonrheumatic aortic valve disorder, unspecified: Secondary | ICD-10-CM

## 2010-06-17 DIAGNOSIS — R7989 Other specified abnormal findings of blood chemistry: Secondary | ICD-10-CM | POA: Insufficient documentation

## 2010-06-17 DIAGNOSIS — I3139 Other pericardial effusion (noninflammatory): Secondary | ICD-10-CM

## 2010-06-17 DIAGNOSIS — D7389 Other diseases of spleen: Secondary | ICD-10-CM | POA: Insufficient documentation

## 2010-06-17 DIAGNOSIS — R509 Fever, unspecified: Secondary | ICD-10-CM

## 2010-06-17 DIAGNOSIS — Q619 Cystic kidney disease, unspecified: Secondary | ICD-10-CM | POA: Insufficient documentation

## 2010-06-17 DIAGNOSIS — I313 Pericardial effusion (noninflammatory): Secondary | ICD-10-CM

## 2010-06-17 DIAGNOSIS — R109 Unspecified abdominal pain: Secondary | ICD-10-CM

## 2010-06-17 DIAGNOSIS — R0989 Other specified symptoms and signs involving the circulatory and respiratory systems: Secondary | ICD-10-CM

## 2010-06-17 DIAGNOSIS — I319 Disease of pericardium, unspecified: Secondary | ICD-10-CM

## 2010-06-17 DIAGNOSIS — K828 Other specified diseases of gallbladder: Secondary | ICD-10-CM | POA: Insufficient documentation

## 2010-06-17 DIAGNOSIS — J90 Pleural effusion, not elsewhere classified: Secondary | ICD-10-CM | POA: Insufficient documentation

## 2010-06-17 DIAGNOSIS — I970 Postcardiotomy syndrome: Secondary | ICD-10-CM | POA: Insufficient documentation

## 2010-06-17 DIAGNOSIS — I4891 Unspecified atrial fibrillation: Secondary | ICD-10-CM

## 2010-06-17 DIAGNOSIS — E7800 Pure hypercholesterolemia, unspecified: Secondary | ICD-10-CM

## 2010-06-17 DIAGNOSIS — I38 Endocarditis, valve unspecified: Secondary | ICD-10-CM | POA: Insufficient documentation

## 2010-06-17 LAB — BASIC METABOLIC PANEL
BUN: 20 mg/dL (ref 6–23)
CO2: 27 mEq/L (ref 19–32)
Calcium: 8.4 mg/dL (ref 8.4–10.5)
Glucose, Bld: 89 mg/dL (ref 70–99)
Sodium: 139 mEq/L (ref 135–145)

## 2010-06-17 LAB — CBC WITH DIFFERENTIAL/PLATELET
Basophils Relative: 0.7 % (ref 0.0–3.0)
Eosinophils Relative: 2.1 % (ref 0.0–5.0)
HCT: 33.6 % — ABNORMAL LOW (ref 39.0–52.0)
Lymphs Abs: 0.6 10*3/uL — ABNORMAL LOW (ref 0.7–4.0)
MCV: 90.9 fl (ref 78.0–100.0)
Monocytes Absolute: 0.3 10*3/uL (ref 0.1–1.0)
RBC: 3.7 Mil/uL — ABNORMAL LOW (ref 4.22–5.81)
WBC: 6 10*3/uL (ref 4.5–10.5)

## 2010-06-17 LAB — HEPATIC FUNCTION PANEL
AST: 138 U/L — ABNORMAL HIGH (ref 0–37)
Albumin: 2.8 g/dL — ABNORMAL LOW (ref 3.5–5.2)
Alkaline Phosphatase: 388 U/L — ABNORMAL HIGH (ref 39–117)
Total Protein: 5.6 g/dL — ABNORMAL LOW (ref 6.0–8.3)

## 2010-06-17 MED ORDER — POTASSIUM CHLORIDE 10 MEQ PO TBCR: 10.0000 meq | EXTENDED_RELEASE_TABLET | Freq: Every day | ORAL | Status: DC

## 2010-06-17 MED ORDER — FUROSEMIDE 40 MG PO TABS: 40.0000 mg | ORAL_TABLET | Freq: Every day | ORAL | Status: DC

## 2010-06-17 MED ORDER — MOXIFLOXACIN HCL 400 MG PO TABS: 400.0000 mg | ORAL_TABLET | Freq: Every day | ORAL | Status: AC

## 2010-06-17 NOTE — Assessment & Plan Note (Signed)
Patient has had fever to as high as 102, chills, and nausea.  Breathing has not worsened.  I will get a CXR today to assess for PNA (can compare to Monday's film).  I will get an echo today to see if there is any evidence for valvular vegetation (need echo anyway to followup after episode of effusive-constrictive pericarditis).  I will get LFTs, BMET, CBC, and blood cultures today.  Followup Monday.

## 2010-06-17 NOTE — Progress Notes (Signed)
Order faxed to [facility] cardiac rehab fax  [phone] ph [phone]

## 2010-06-17 NOTE — Patient Instructions (Addendum)
Lab today--Blood cultures/BMP/BNP/CBC/liver profile/TSH 424.1  414.01 780.6    423.9  CXR today--this will be at the [facility] office.  Schedule an appointment for an echocardiogram.   Schedule an appointment to see Dr Shirlee Latch in 1 week. Dr Shirlee Latch would like for you to have the echo before he sees you in 1 week.  In 2 weeks you can stop the Amiodarone. When you stop Amiodarone, you should increase Coreg(carvedilol) to 6.25mg  twice a day.

## 2010-06-17 NOTE — Assessment & Plan Note (Signed)
Will need lipids/LFTs in about a month.  Goal LDL < 70 with known CAD.

## 2010-06-17 NOTE — Assessment & Plan Note (Signed)
No chest pain.  Patient had SVG-OM and SVG-D at the time of his AVR.  He will continue ASA 81, statin, and Coreg.
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
## 2010-06-17 NOTE — Assessment & Plan Note (Signed)
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  He is now s/p pleural effusion drainage and pericardial window.  He has completed a course of prednisone.  I will get an echo to reassess the LV and RV to make sure that the changes of interventricular interdependence related to effusive-constrictive pericarditis have resolved.

## 2010-06-17 NOTE — Assessment & Plan Note (Addendum)
Volume status is much better compared to his last hospitalization but he still has some volume overload with mild JVP elevation.  I will get a BMET to check his creatinine today and a BNP.  I will restart him on some Lasix when I get the creatinine result back.

## 2010-06-17 NOTE — Telephone Encounter (Signed)
Reviewed lab results done today with Dr Shirlee Latch. Dr Shirlee Latch ordered abdominal ultrasound to R/O cholecystitis, Avelox 400mg  daily for 14 days, Lasix 40mg  daily, KCL 10 mEq daily. Pt to keep appt already scheduled with Dr Shirlee Latch for 06/21/10

## 2010-06-17 NOTE — Progress Notes (Signed)
Addended by: Katina Dung on: 06/17/2010 12:01 PM   Modules accepted: Orders

## 2010-06-17 NOTE — Assessment & Plan Note (Signed)
Patient had bioprosthetic aortic valve replacement for severe AS.  Valve looked well-seated on echo during 3/12 admission.

## 2010-06-17 NOTE — Assessment & Plan Note (Signed)
Patient had post-op atrial fibrillation that has not recurred.  He is in sinus rhythm today.  Coumadin was stopped at the time of admission with pleural and pericardial effusions.  He will continue amiodarone for a month post-op then stop it.  When he stops amiodarone, he will increase Coreg to 6.25 mg bid.

## 2010-06-21 ENCOUNTER — Encounter: Payer: Self-pay | Admitting: Cardiology

## 2010-06-21 ENCOUNTER — Ambulatory Visit (HOSPITAL_COMMUNITY): Payer: Medicare Other | Admitting: Radiology

## 2010-06-21 ENCOUNTER — Ambulatory Visit (INDEPENDENT_AMBULATORY_CARE_PROVIDER_SITE_OTHER): Payer: Medicare Other | Admitting: Cardiology

## 2010-06-21 ENCOUNTER — Other Ambulatory Visit (HOSPITAL_COMMUNITY): Payer: Medicare Other | Admitting: Radiology

## 2010-06-21 DIAGNOSIS — I5032 Chronic diastolic (congestive) heart failure: Secondary | ICD-10-CM

## 2010-06-21 DIAGNOSIS — I509 Heart failure, unspecified: Secondary | ICD-10-CM

## 2010-06-21 DIAGNOSIS — I359 Nonrheumatic aortic valve disorder, unspecified: Secondary | ICD-10-CM

## 2010-06-21 DIAGNOSIS — R509 Fever, unspecified: Secondary | ICD-10-CM

## 2010-06-21 DIAGNOSIS — Z952 Presence of prosthetic heart valve: Secondary | ICD-10-CM

## 2010-06-21 DIAGNOSIS — R7989 Other specified abnormal findings of blood chemistry: Secondary | ICD-10-CM

## 2010-06-21 DIAGNOSIS — I4891 Unspecified atrial fibrillation: Secondary | ICD-10-CM

## 2010-06-21 DIAGNOSIS — I2510 Atherosclerotic heart disease of native coronary artery without angina pectoris: Secondary | ICD-10-CM

## 2010-06-21 LAB — CBC WITH DIFFERENTIAL/PLATELET
Eosinophils Relative: 5.3 % — ABNORMAL HIGH (ref 0.0–5.0)
HCT: 34.2 % — ABNORMAL LOW (ref 39.0–52.0)
Lymphs Abs: 0.7 10*3/uL (ref 0.7–4.0)
Monocytes Relative: 5.7 % (ref 3.0–12.0)
Neutrophils Relative %: 79.5 % — ABNORMAL HIGH (ref 43.0–77.0)
Platelets: 214 10*3/uL (ref 150.0–400.0)
WBC: 7.3 10*3/uL (ref 4.5–10.5)

## 2010-06-21 LAB — HEPATIC FUNCTION PANEL
ALT: 165 U/L — ABNORMAL HIGH (ref 0–53)
Albumin: 2.7 g/dL — ABNORMAL LOW (ref 3.5–5.2)
Bilirubin, Direct: 0.2 mg/dL (ref 0.0–0.3)
Total Protein: 5.6 g/dL — ABNORMAL LOW (ref 6.0–8.3)

## 2010-06-21 LAB — BASIC METABOLIC PANEL
BUN: 18 mg/dL (ref 6–23)
GFR: 53.89 mL/min — ABNORMAL LOW (ref >60.00)
Potassium: 4 mEq/L (ref 3.5–5.1)

## 2010-06-21 MED ORDER — CARVEDILOL 6.25 MG PO TABS: 6.2500 mg | ORAL_TABLET | Freq: Two times a day (BID) | ORAL | Status: DC

## 2010-06-21 NOTE — Assessment & Plan Note (Signed)
Patient had post-op atrial fibrillation that has not recurred.  He is in sinus rhythm today.  Coumadin was stopped at the time of admission with pleural and pericardial effusions.  With increased LFTs, amiodarone will be stopped (symptom/sign complex could be related to amiodarone toxicity).  I will increase Coreg to 6.25 mg bid.

## 2010-06-21 NOTE — Assessment & Plan Note (Addendum)
No chest pain.  Patient had SVG-OM and SVG-D at the time of his AVR.  He will continue ASA (increase to 162 mg daily) and Coreg.  Patient will start cardiac rehab at [facility].  Statin held with increased LFTs.
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
## 2010-06-21 NOTE — Patient Instructions (Signed)
Stop amiodarone.  Hold Crestor for now.  Increase coreg(carvedilol) to 6.25mg  twice a day. You can take two 3.125mg  tablets twice a day.  Lab today-liver profile/CBC/BMP 428.32  424.1  Schedule an appointment with Dr Shirlee Latch for 1 week.  Dr Shirlee Latch has made a referral to [facility]--their phone number is [phone]. Call them if you do hear from  them in a few days.

## 2010-06-21 NOTE — Assessment & Plan Note (Signed)
Bioprosthetic aortic valve placed recently.  Mean gradient 11 mmHg.  No evidence for endocarditis so far by echo or blood cultures.

## 2010-06-21 NOTE — Assessment & Plan Note (Unsigned)
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  The patient did have some postoperative atrial fibrillation as well as renal insufficiency and volume overload. His atrial fibrillation ultimately converted on amiodarone and his renal insufficiency improved.  He was discharged to home on Lasix for his volume overload.  He contacted the office over the weekend and spoke to Dr. Laneta Simmers with complaints of increasing shortness of breath and swelling from his abdomen down to his feet.  Dr. Laneta Simmers instructed the patient to present to the office today, Monday April 25, 2010, and obtain PA and lateral chest x-ray as well as CBC and BMP.  The patient presents to the office today.  He complains of increased swelling from his abdomen done as well as increased shortness of breath since he has been discharged.  The patient was discharged to home on May 16, 2010. He states that he has been checking his weight daily and over the past 1 to 2 days, his weight has increased several pounds.  He finished up his Lasix which he was taking 40 mg daily yesterday.  He has increasing shortness of breath when he lays flat.  The patient is up ambulating around the house but states his legs are still very tight.  He denies any chest pain, incisional pain, fevers, nausea, vomiting.  He was discharged to home on Coumadin and this is being followed by [facility].  He has an appointment to get this rechecked tomorrow, May 25, 2010.  His most recent INR was 3.3.  The patient states he is having problems sleeping at night.  He states, he does take some naps during the day.  He is tolerating diet well.  PHYSICAL EXAMINATION:  Vital  Signs:  Blood pressure of 148/90, pulse of 80, respirations of 20, O2 sats 95% on room air.  The patient's weight today is 91.8 kg and his preoperative weight is 84.4 kg.  Respiratory: Patient does have diminished breath sounds at bilateral bases.  Cardiac: Regular rate and rhythm.  No murmurs noted.  No rubs noted.  Abdomen: Bowel sounds x4.  Soft and nontender.  Extremities:  The patient has positive pitting edema noted.  Lower extremities bilaterally.  He does have 2+ bilateral DP and PT pulses noted.  Feet are warm.  Incisions: All incisions are clean, dry, and intact and healing well.  LABORATORY DATA:  Studies the patient had PA and lateral chest x-ray obtained today which showed slight increase in his bilateral pleural effusions.  He had a CBC and BMP done today which shows white blood cell count 11.3, hemoglobin 10.7, hematocrit 31.2, platelet count 208. Sodium of 141, potassium 4.6, chloride of 104, bicarbonate 28, BUN of 23, creatinine 1.3, and glucose 125.  IMPRESSION AND PLAN:  The patient is seen with edema status post aortic valve replacement, pericardial valve, as well as coronary artery bypass grafting.  I did discuss the case with Dr. Cornelius Moras and plan to restart the patient's Lasix at 40 mg b.i.d. as well as potassium 20 mEq b.i.d.  The patient  is to continue checking daily weights in the morning.  He has continued elevating his feet while sitting or laying down.  I will bring the patient back in a week to see Dr. [name] with a repeat PA and lateral chest x-ray.  The patient is instructed in the interim if his weight increases has any increasing shortness of breath or edema, he is to contact us and we will see him sooner.  The patient is in agreement. He is to keep his appointment tomorrow to check his INR at [facility].  Sol Blazing, PA  KMD/MEDQ  D:  05/24/2010  T:  05/25/2010  Job:  [id number]  cc:   Marca Ancona, MD

## 2010-06-21 NOTE — Assessment & Plan Note (Signed)
Volume status is better on Lasix and he denies exertional dyspnea.  He will continue current Lasix and KCl.  I will get a BMET today.

## 2010-06-21 NOTE — Assessment & Plan Note (Signed)
Workup has included blood cultures, which are negative to date, CXR with bibasilar opacities (may be atelectasis but cannot rule out PNA), echo without obvious endocarditis, and abdominal ultrasound without evidence for cholecystitis.  Surgical site looks benign.  Platelets were low and LFTs were high.  I have started him on moxifloxacin for pneumonia and fevers seem to have resolved.  He is feeling overall better.  Continue moxifloxacin for 2 weeks. Will followup blood cultures.  I will see him back in 1 week.

## 2010-06-21 NOTE — Assessment & Plan Note (Signed)
Increased LFTs when checked at the end of last week, both bilirubin/alkaline phosphatase and transaminases.  He will stop amiodarone and statin.  This may be due to amiodarone.  It is possible that amiodarone is the culprit for the entire presentation.  Elevated LFTs could also be a consequence of infection as well.  Will repeat LFTs today and in 1 week when I see him back.

## 2010-06-22 LAB — CBC
HCT: 42.6 % (ref 39.0–52.0)
Platelets: 153 10*3/uL (ref 150–400)
RDW: 16.2 % — ABNORMAL HIGH (ref 11.5–15.5)
WBC: 6.4 10*3/uL (ref 4.0–10.5)

## 2010-06-22 LAB — BASIC METABOLIC PANEL
CO2: 25 mEq/L (ref 19–32)
Chloride: 105 mEq/L (ref 96–112)
GFR calc Af Amer: >60 mL/min (ref >60)
Sodium: 137 mEq/L (ref 135–145)

## 2010-06-28 ENCOUNTER — Ambulatory Visit (INDEPENDENT_AMBULATORY_CARE_PROVIDER_SITE_OTHER): Payer: Medicare Other | Admitting: Cardiology

## 2010-06-28 ENCOUNTER — Encounter: Payer: Self-pay | Admitting: Cardiology

## 2010-06-28 DIAGNOSIS — R0602 Shortness of breath: Secondary | ICD-10-CM

## 2010-06-28 DIAGNOSIS — R7989 Other specified abnormal findings of blood chemistry: Secondary | ICD-10-CM

## 2010-06-28 DIAGNOSIS — I2510 Atherosclerotic heart disease of native coronary artery without angina pectoris: Secondary | ICD-10-CM

## 2010-06-28 DIAGNOSIS — I509 Heart failure, unspecified: Secondary | ICD-10-CM

## 2010-06-28 DIAGNOSIS — I25810 Atherosclerosis of coronary artery bypass graft(s) without angina pectoris: Secondary | ICD-10-CM

## 2010-06-28 DIAGNOSIS — Z954 Presence of other heart-valve replacement: Secondary | ICD-10-CM

## 2010-06-28 DIAGNOSIS — I97190 Other postprocedural cardiac functional disturbances following cardiac surgery: Secondary | ICD-10-CM

## 2010-06-28 DIAGNOSIS — Z952 Presence of prosthetic heart valve: Secondary | ICD-10-CM

## 2010-06-28 DIAGNOSIS — I5032 Chronic diastolic (congestive) heart failure: Secondary | ICD-10-CM

## 2010-06-28 DIAGNOSIS — I059 Rheumatic mitral valve disease, unspecified: Secondary | ICD-10-CM

## 2010-06-28 DIAGNOSIS — R509 Fever, unspecified: Secondary | ICD-10-CM

## 2010-06-28 DIAGNOSIS — I970 Postcardiotomy syndrome: Secondary | ICD-10-CM

## 2010-06-28 DIAGNOSIS — I4891 Unspecified atrial fibrillation: Secondary | ICD-10-CM

## 2010-06-28 LAB — CBC
HCT: 33.5 % — ABNORMAL LOW (ref 39.0–52.0)
Hemoglobin: 11.3 g/dL — ABNORMAL LOW (ref 13.0–17.0)
MCHC: 33.6 g/dL (ref 30.0–36.0)
MCV: 94.8 fL (ref 78.0–100.0)
Platelets: 212 10*3/uL (ref 150–400)
RBC: 4.5 MIL/uL (ref 4.22–5.81)
RBC: 3.53 MIL/uL — ABNORMAL LOW (ref 4.22–5.81)
WBC: 11 10*3/uL — ABNORMAL HIGH (ref 4.0–10.5)

## 2010-06-28 LAB — TYPE AND SCREEN
ABO/RH(D): A POS
Antibody Screen: NEGATIVE

## 2010-06-28 LAB — POCT I-STAT, CHEM 8
Chloride: 107 mEq/L (ref 96–112)
HCT: 42 % (ref 39.0–52.0)
Hemoglobin: 14.3 g/dL (ref 13.0–17.0)
Potassium: 3.4 mEq/L — ABNORMAL LOW (ref 3.5–5.1)
Sodium: 142 mEq/L (ref 135–145)

## 2010-06-28 LAB — BASIC METABOLIC PANEL
CO2: 27 mEq/L (ref 19–32)
Calcium: 9.4 mg/dL (ref 8.4–10.5)
Chloride: 108 mEq/L (ref 96–112)
GFR calc Af Amer: >60 mL/min (ref >60)
Potassium: 4.3 mEq/L (ref 3.5–5.1)
Sodium: 141 mEq/L (ref 135–145)

## 2010-06-28 LAB — GASTRIC OCCULT BLOOD (1-CARD TO LAB)
Occult Blood, Gastric: POSITIVE — AB
pH, Gastric: 5

## 2010-06-28 LAB — HEPATIC FUNCTION PANEL: Total Bilirubin: 0.6 mg/dL (ref 0.3–1.2)

## 2010-06-28 LAB — ABO/RH: ABO/RH(D): A POS

## 2010-06-28 LAB — PROTIME-INR: Prothrombin Time: 13.5 seconds (ref 11.6–15.2)

## 2010-06-28 MED ORDER — FUROSEMIDE 20 MG PO TABS: 20.0000 mg | ORAL_TABLET | Freq: Every day | ORAL | Status: DC

## 2010-06-28 NOTE — Patient Instructions (Signed)
Decrease Lasix(furosemide) to 20mg  daily--you can take 1/2 of a 40mg  tablet daily.  Lab today--liver profile/BNP/BMP 414.05  424.0  Schedule an appointment to see Dr Shirlee Latch in 1 month --this can be scheduled in the [place] office.

## 2010-06-29 ENCOUNTER — Telehealth: Payer: Self-pay | Admitting: *Deleted

## 2010-06-29 ENCOUNTER — Encounter: Payer: Self-pay | Admitting: Cardiology

## 2010-06-29 DIAGNOSIS — I2510 Atherosclerotic heart disease of native coronary artery without angina pectoris: Secondary | ICD-10-CM

## 2010-06-29 DIAGNOSIS — E7800 Pure hypercholesterolemia, unspecified: Secondary | ICD-10-CM

## 2010-06-29 NOTE — Assessment & Plan Note (Signed)
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  He is now s/p pleural effusion drainage and pericardial window.  He has completed a course of prednisone.

## 2010-06-29 NOTE — Assessment & Plan Note (Signed)
No chest pain.  Patient had SVG-OM and SVG-D at the time of his AVR.  He will continue ASA and Coreg.  Patient will start cardiac rehab at [facility] this week.  Statin held with increased LFTs, will restart when these normalize. [name]

## 2010-06-29 NOTE — Telephone Encounter (Signed)
Message copied by Katina Dung on Tue Jun 29, 2010  6:19 PM ------      Message from: [facility], Freida Busman      Created: Tue Jun 29, 2010  5:36 PM       Restart Crestor 10 mg daily, check LFTs in 2 weeks.

## 2010-06-29 NOTE — Assessment & Plan Note (Signed)
Volume status improved, close to euvolemic.  I will decrease Lasix to 20 mg daily.  BNP with labs today.

## 2010-06-29 NOTE — Assessment & Plan Note (Signed)
Bioprosthetic aortic valve, well-seated on last echo.
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
## 2010-06-29 NOTE — Assessment & Plan Note (Signed)
Post-op atrial fibrillation.  No symptomatic recurrence.  Now off amiodarone.

## 2010-06-29 NOTE — Telephone Encounter (Signed)
Notes Recorded by Marca Ancona, MD on 06/29/2010 at 5:36 PM Restart Crestor 10 mg daily, check LFTs in 2 weeks.  I talked with pt. Liver profile 07/12/10.

## 2010-06-29 NOTE — Assessment & Plan Note (Signed)
This seems to be resolved.  I suspect he had pneumonia.  He has almost completed his moxifloxacin course and feels much better.

## 2010-06-29 NOTE — Assessment & Plan Note (Signed)
Increased LFTs, both bilirubin/alkaline phosphatase and transaminases.  Amiodarone and statin were stopped.  LFTs were decreasing last week and hopefully will be lower when they are rechecked today.  Once they are back to normal, I will reintroduce statin but leave him off amiodarone.

## 2010-07-06 ENCOUNTER — Other Ambulatory Visit: Payer: Self-pay | Admitting: *Deleted

## 2010-07-06 MED ORDER — ALPRAZOLAM 0.25 MG PO TABS: 0.2500 mg | ORAL_TABLET | Freq: Every evening | ORAL | Status: DC | PRN

## 2010-07-06 NOTE — Telephone Encounter (Signed)
Please call in

## 2010-07-06 NOTE — Telephone Encounter (Signed)
Medication phoned to pharmacy.  

## 2010-07-12 ENCOUNTER — Other Ambulatory Visit (INDEPENDENT_AMBULATORY_CARE_PROVIDER_SITE_OTHER): Payer: Medicare Other | Admitting: *Deleted

## 2010-07-12 DIAGNOSIS — I2510 Atherosclerotic heart disease of native coronary artery without angina pectoris: Secondary | ICD-10-CM

## 2010-07-12 DIAGNOSIS — R0602 Shortness of breath: Secondary | ICD-10-CM

## 2010-07-12 DIAGNOSIS — I059 Rheumatic mitral valve disease, unspecified: Secondary | ICD-10-CM

## 2010-07-12 DIAGNOSIS — I25810 Atherosclerosis of coronary artery bypass graft(s) without angina pectoris: Secondary | ICD-10-CM

## 2010-07-12 DIAGNOSIS — E7800 Pure hypercholesterolemia, unspecified: Secondary | ICD-10-CM

## 2010-07-12 LAB — HEPATIC FUNCTION PANEL
AST: 16 U/L (ref 0–37)
Albumin: 3.4 g/dL — ABNORMAL LOW (ref 3.5–5.2)
Alkaline Phosphatase: 98 U/L (ref 39–117)
Total Protein: 6.2 g/dL (ref 6.0–8.3)

## 2010-07-12 LAB — BASIC METABOLIC PANEL
BUN: 19 mg/dL (ref 6–23)
GFR: 57.7 mL/min — ABNORMAL LOW (ref >60.00)
Glucose, Bld: 95 mg/dL (ref 70–99)
Potassium: 3.7 mEq/L (ref 3.5–5.1)

## 2010-07-13 ENCOUNTER — Encounter: Payer: Self-pay | Admitting: Cardiology

## 2010-07-16 ENCOUNTER — Telehealth: Payer: Self-pay | Admitting: *Deleted

## 2010-07-16 NOTE — Telephone Encounter (Signed)
Pt asking to stop Lasix and KCL. I reviewed with Dr Shirlee Latch. OK for pt to stop KCL and Lasix. Pt aware. Pt will weigh daily. He has followup scheduled with Dr Shirlee Latch 07/30/10 .

## 2010-07-16 NOTE — Telephone Encounter (Signed)
I talked with pt. Pt restarted Crestor 10mg  06/29/10. (See liver profile  dated 06/28/10) Pt will return for fasting lipid /liver profile 08/10/10.

## 2010-07-16 NOTE — Telephone Encounter (Signed)
[name], please have [name] restart his statin at the prior dose. Needs LFTs in 1 month after restarting statin.  [place]
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
## 2010-07-27 NOTE — Assessment & Plan Note (Signed)
[facility] OFFICE NOTE   [name], [name]                      MRN:          [id number]  DATE:02/05/2008                            DOB:          1940/11/17    PRIMARY CARE PHYSICIAN:  Arta Silence, MD at [facility].   HISTORY OF PRESENT ILLNESS:  This is a 70 year old with no significant  past medical history who was noted to have a murmur on exam by his  primary care physician, and he was found to have moderate aortic  stenosis on echo.  The patient states that he has been told that he has  a murmur for about 3 years now.  He is quite active.  He works out at  [employer] 3 times a week, that workout involves weights as well as  walking and jogging for a mile.  He does a mile in about 14 minutes.  He  is completely asymptomatic with exertion.  He can do his exercises and  he can do all the moderate exertion that he wants without having any  significant shortness of breath.  He has had no episodes of chest pain.  He has had no episodes of syncope or presyncope.  Of note, the patient  has also been found to have mild hypertension by his primary care  physician, his last blood pressure was 140/80 at Dr. Lorenza Chick office,  however, today, his blood pressure is actually 180/84.  The patient is  on no medications currently for his blood pressure.   PAST MEDICAL HISTORY:  1. Moderate aortic stenosis.  Echocardiogram in October 2009 showed EF      of 60%.  There is no regional wall motion abnormality.  There is      mild left ventricular hypertrophy.  There is mild diastolic      dysfunction.  There is moderate aortic stenosis with a mean      gradient of 26 mmHg.  Pulmonary artery systolic pressure was not      elevated, at 29 mmHg.  2. Hypertension.  3. History of herpes zoster.  4. Benign prostatic hypertrophy.   SOCIAL HISTORY:  The patient is a nonsmoker, rarely drinks alcohol.  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   FAMILY HISTORY:  The patient's father died with cirrhosis.  His mother  died actually from status asthmaticus at the age of 53.   REVIEW OF SYSTEMS:  Negative except as noted in the history of present  illness.   MEDICATIONS:  Finasteride 5 mg daily.   EKG, normal sinus rhythm.  This is a normal EKG.  There is no LVH on  EKG.   PHYSICAL EXAMINATION:  VITAL SIGNS:  Blood pressure 180/84, heart rate  is 80 and regular, and weight is 190 pounds.  GENERAL:  This is a well-developed male in no apparent distress.  NEUROLOGIC:  Alert and oriented x3.  Normal affect.  HEENT:  Normal exam.  NECK:  There is no JVD.  There is no thyromegaly or thyroid nodule.  CARDIOVASCULAR:  Heart regular.  S1 and S2.  There is no S3 or S4.  There is a 3/6 crescendo-decrescendo mid peaking systolic murmur heard  best at the right upper sternal border that radiates to the clavicles in  the neck.  S2 is clearly heard.  Carotid upstrokes are normal.  ABDOMEN:  Soft and nontender.  No hepatosplenomegaly.  EXTREMITIES:  There is trace edema at the ankles.  There are 2+  posterior tibial pulses bilaterally.  No clubbing or cyanosis.  SKIN:  Normal.  MUSCULOSKELETAL:  Normal.   ASSESSMENT AND PLAN:  This is a 70 year old with moderate aortic  stenosis who presents to Cardiology Clinic for evaluation.  1. Aortic stenosis.  The patient's echocardiogram shows moderate      aortic stenosis with a mean gradient of 26 mmHg.  He is 70 years      old, so most likely this is probably calcific degeneration of a      normal aortic valve though I will go back and make sure on his echo      that it is a trileaflet and not a bileaflet valve.  The patient is      actually completely asymptomatic.  He does not get dyspnea on      exertion, episodes of chest pain, or episodes of presyncope.  For      now, I think our goal will be to monitor him and see how fast his       aortic stenosis progresses.  We will do an echocardiogram in 6      months.  If there has been no significant progression of his mean      gradient, then we will plan on yearly echoes to follow his aortic      stenosis.  I did talk to him about options in the future when the      aortic stenosis progresses as it likely will though we are not sure      how fast that progression will be.  2. We do need to check the patient's cholesterol, my goal would be a      LDL at least below 100 for him as there is some evidence though not      particularly strong that keeping lipids low may help reduce      progression of aortic stenosis.  3. Hypertension.  The patient's blood pressure is elevated at 180/84      today, it was 140/80 when he saw his primary care physician last      week.  We will start him on lisinopril 10 mg a day today.  We will      then have him come back to the office in 2 weeks for chem-7 and      lipids.  At that time, if his blood pressure is still elevated we      will titrate up the lisinopril.  4. I will see the patient back in the office in about 6 months after      his next echo is done.  5. The patient has been told to take aspirin 81 mg daily.     Marca Ancona, MD  Electronically Signed    DM/MedQ  DD: 02/05/2008  DT: 02/06/2008  Job #: [id number]   cc:   Arta Silence, MD
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
## 2010-07-28 ENCOUNTER — Telehealth: Payer: Self-pay | Admitting: Cardiology

## 2010-07-28 DIAGNOSIS — E7800 Pure hypercholesterolemia, unspecified: Secondary | ICD-10-CM

## 2010-07-28 DIAGNOSIS — I2510 Atherosclerotic heart disease of native coronary artery without angina pectoris: Secondary | ICD-10-CM

## 2010-07-28 MED ORDER — ROSUVASTATIN CALCIUM 10 MG PO TABS: 10.0000 mg | ORAL_TABLET | Freq: Every day | ORAL | Status: DC

## 2010-07-28 NOTE — Telephone Encounter (Signed)
Needs refill  crestor 10 mg at [facility]

## 2010-07-29 ENCOUNTER — Ambulatory Visit: Payer: Medicare Other | Admitting: Cardiology

## 2010-07-30 ENCOUNTER — Encounter: Payer: Self-pay | Admitting: Cardiology

## 2010-07-30 ENCOUNTER — Ambulatory Visit (INDEPENDENT_AMBULATORY_CARE_PROVIDER_SITE_OTHER): Payer: Medicare Other | Admitting: Cardiology

## 2010-07-30 DIAGNOSIS — I2510 Atherosclerotic heart disease of native coronary artery without angina pectoris: Secondary | ICD-10-CM

## 2010-07-30 DIAGNOSIS — Z952 Presence of prosthetic heart valve: Secondary | ICD-10-CM

## 2010-07-30 DIAGNOSIS — I359 Nonrheumatic aortic valve disorder, unspecified: Secondary | ICD-10-CM

## 2010-07-30 DIAGNOSIS — I10 Essential (primary) hypertension: Secondary | ICD-10-CM

## 2010-07-30 DIAGNOSIS — E7800 Pure hypercholesterolemia, unspecified: Secondary | ICD-10-CM

## 2010-07-30 MED ORDER — ROSUVASTATIN CALCIUM 10 MG PO TABS: 10.0000 mg | ORAL_TABLET | Freq: Every day | ORAL | Status: DC

## 2010-07-30 MED ORDER — LISINOPRIL 10 MG PO TABS: 10.0000 mg | ORAL_TABLET | Freq: Every day | ORAL | Status: DC

## 2010-07-30 NOTE — Patient Instructions (Signed)
Start Lisinopril 10mg  once daily Your physician has requested that you regularly monitor and record your blood pressure readings at home. Please use the same machine at the same time of day to check your readings and record them and call the nurse with your results in the next 2 weeks. Your physician recommends that you schedule a follow-up appointment in: 4 months

## 2010-08-01 NOTE — Assessment & Plan Note (Signed)
Bioprosthetic aortic valve, well-seated on last echo. 

## 2010-08-01 NOTE — Assessment & Plan Note (Signed)
No chest pain.  Patient had SVG-OM and SVG-D at the time of his AVR.  He will continue ASA and Coreg.  Statin has been restarted.  Continue cardiac rehab at [facility].

## 2010-08-01 NOTE — Assessment & Plan Note (Signed)
Start lisinopril with BMET in 2 wks.  Titrate up if needed.
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
## 2010-08-01 NOTE — Assessment & Plan Note (Signed)
Continue Crestor, lipids/LFTs in 2 weeks.

## 2010-08-02 ENCOUNTER — Telehealth: Payer: Self-pay | Admitting: Cardiology

## 2010-08-02 NOTE — Telephone Encounter (Signed)
Pt has question re increasing lisinopril

## 2010-08-03 ENCOUNTER — Other Ambulatory Visit: Payer: Self-pay | Admitting: *Deleted

## 2010-08-03 NOTE — Telephone Encounter (Signed)
I talked with pt. Pt states BP has been 147-180/83-90. Lisinopril 10mg  daily started 07/30/10. Pt states he had been on 40mg  daily previously. Pt asking if he should increase Lisinopril. I reviewed with Dr Shirlee Latch. Dr Shirlee Latch recommended pt increase Lisinopril to 20mg  daily but did not want to increase an further until MP is done next week. Pt is aware of the recommendations and verbalized understanding.

## 2010-08-03 NOTE — Telephone Encounter (Signed)
LMTCB

## 2010-08-03 NOTE — Telephone Encounter (Signed)
Pt returning your call

## 2010-08-03 NOTE — Telephone Encounter (Signed)
I called pt he states his BP is increasing  he was on lisinopril 40 mg in the past and Dr. Shirlee Latch put him on 10 mg a day. Pt asked what does Dr. Shirlee Latch want him to do as far as dosage of medications does he want him to increase it to 40 or stay on 10

## 2010-08-06 ENCOUNTER — Ambulatory Visit (INDEPENDENT_AMBULATORY_CARE_PROVIDER_SITE_OTHER): Payer: Medicare Other | Admitting: Family Medicine

## 2010-08-06 ENCOUNTER — Encounter: Payer: Self-pay | Admitting: Family Medicine

## 2010-08-06 VITALS — BP 150/80 | HR 72 | Temp 98.8°F | Wt 177.1 lb

## 2010-08-06 DIAGNOSIS — L237 Allergic contact dermatitis due to plants, except food: Secondary | ICD-10-CM

## 2010-08-06 DIAGNOSIS — N5082 Scrotal pain: Secondary | ICD-10-CM

## 2010-08-06 DIAGNOSIS — L255 Unspecified contact dermatitis due to plants, except food: Secondary | ICD-10-CM

## 2010-08-06 DIAGNOSIS — N509 Disorder of male genital organs, unspecified: Secondary | ICD-10-CM

## 2010-08-06 LAB — POCT URINALYSIS DIPSTICK
Ketones, UA: NEGATIVE
Protein, UA: NEGATIVE
Spec Grav, UA: 1.005
pH, UA: 7.5

## 2010-08-06 MED ORDER — TRIAMCINOLONE ACETONIDE 40 MG/ML IJ SUSP
60.0000 mg | Freq: Once | INTRAMUSCULAR | Status: AC
  Administered 2010-08-06: 60 mg via INTRAMUSCULAR

## 2010-08-06 NOTE — Patient Instructions (Signed)
I would take claritin 10mg  a day and see if that helps.  Take care.

## 2010-08-06 NOTE — Assessment & Plan Note (Addendum)
D/w pt about options and he wanted to proceed with IM steroid.  Steroid cautions given.  He understood.  Use claritin too and fu prn.

## 2010-08-06 NOTE — Progress Notes (Signed)
Poison oak exposure Tuesday.  Itching.  No FCNAVD.  Similar to prev.  The only thing that helped before was steroid injection.  Didn't get better with oral/topical meds prev.    Feeling well o/w, esp given prev illness with valve replacement.  Meds, vitals, and allergies reviewed.   ROS: See HPI.  Otherwise, noncontributory.  nad ncat rrr with SEM note, sternotomy well healed. Scattered rash on arms, hands.  Erythematous vesicular rash w/o purulent discharge Ext w/o edema

## 2010-08-07 ENCOUNTER — Encounter: Payer: Self-pay | Admitting: Family Medicine

## 2010-08-10 ENCOUNTER — Other Ambulatory Visit (INDEPENDENT_AMBULATORY_CARE_PROVIDER_SITE_OTHER): Payer: Medicare Other | Admitting: *Deleted

## 2010-08-10 DIAGNOSIS — E7800 Pure hypercholesterolemia, unspecified: Secondary | ICD-10-CM

## 2010-08-10 DIAGNOSIS — I2510 Atherosclerotic heart disease of native coronary artery without angina pectoris: Secondary | ICD-10-CM

## 2010-08-10 LAB — HEPATIC FUNCTION PANEL
AST: 15 U/L (ref 0–37)
Alkaline Phosphatase: 56 U/L (ref 39–117)
Total Bilirubin: 0.8 mg/dL (ref 0.3–1.2)

## 2010-08-10 LAB — LIPID PANEL: Total CHOL/HDL Ratio: 3

## 2010-08-10 LAB — BASIC METABOLIC PANEL
BUN: 18 mg/dL (ref 6–23)
CO2: 26 mEq/L (ref 19–32)
Calcium: 9 mg/dL (ref 8.4–10.5)
Creatinine, Ser: 1.1 mg/dL (ref 0.4–1.5)
Glucose, Bld: 103 mg/dL — ABNORMAL HIGH (ref 70–99)

## 2010-08-11 NOTE — Progress Notes (Signed)
LMOM TCB/sab 

## 2010-08-27 ENCOUNTER — Encounter: Payer: Self-pay | Admitting: Cardiology

## 2010-09-10 ENCOUNTER — Other Ambulatory Visit: Payer: Medicare Other | Admitting: *Deleted

## 2010-10-29 ENCOUNTER — Other Ambulatory Visit: Payer: Self-pay | Admitting: Family Medicine

## 2010-12-02 ENCOUNTER — Ambulatory Visit: Payer: Medicare Other | Admitting: Cardiology

## 2010-12-22 ENCOUNTER — Encounter: Payer: Self-pay | Admitting: Internal Medicine

## 2010-12-23 ENCOUNTER — Ambulatory Visit (INDEPENDENT_AMBULATORY_CARE_PROVIDER_SITE_OTHER): Payer: Medicare Other

## 2010-12-23 ENCOUNTER — Encounter: Payer: Self-pay | Admitting: Cardiology

## 2010-12-23 ENCOUNTER — Ambulatory Visit (INDEPENDENT_AMBULATORY_CARE_PROVIDER_SITE_OTHER): Payer: Medicare Other | Admitting: Cardiology

## 2010-12-23 VITALS — BP 155/87 | HR 69 | Ht 70.0 in | Wt 181.0 lb

## 2010-12-23 DIAGNOSIS — Z23 Encounter for immunization: Secondary | ICD-10-CM

## 2010-12-23 DIAGNOSIS — Z952 Presence of prosthetic heart valve: Secondary | ICD-10-CM

## 2010-12-23 DIAGNOSIS — Z79899 Other long term (current) drug therapy: Secondary | ICD-10-CM

## 2010-12-23 DIAGNOSIS — I10 Essential (primary) hypertension: Secondary | ICD-10-CM

## 2010-12-23 DIAGNOSIS — I359 Nonrheumatic aortic valve disorder, unspecified: Secondary | ICD-10-CM

## 2010-12-23 DIAGNOSIS — E785 Hyperlipidemia, unspecified: Secondary | ICD-10-CM

## 2010-12-23 DIAGNOSIS — E7800 Pure hypercholesterolemia, unspecified: Secondary | ICD-10-CM

## 2010-12-23 DIAGNOSIS — I2510 Atherosclerotic heart disease of native coronary artery without angina pectoris: Secondary | ICD-10-CM

## 2010-12-23 MED ORDER — HYDROCHLOROTHIAZIDE 25 MG PO TABS: 25.0000 mg | ORAL_TABLET | Freq: Every day | ORAL | Status: DC

## 2010-12-23 NOTE — Patient Instructions (Signed)
Follow up in 6 months with liver/lipid prior to visit. Start on Hydrochlorothiazide 25 mg take one tablet daily. Need to have a BMET in two weeks.
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
## 2010-12-25 NOTE — Assessment & Plan Note (Signed)
Will get lipids/LFTs.  Goal LDL < 70.

## 2010-12-25 NOTE — Assessment & Plan Note (Signed)
Bioprosthetic aortic valve, well-seated on last echo.

## 2010-12-25 NOTE — Assessment & Plan Note (Signed)
BP remains elevated.  He is on lisinopril 40 and Coreg 6.25 mg bid.  HR is in the 60s so will not change Coreg.  I will start him on HCTZ 25 mg daily with BMET in 2 wks.

## 2010-12-25 NOTE — Assessment & Plan Note (Signed)
No chest pain.  Patient had SVG-OM and SVG-D at the time of his AVR.  He will continue ASA, statin, and Coreg.

## 2010-12-31 ENCOUNTER — Other Ambulatory Visit: Payer: Self-pay | Admitting: Family Medicine

## 2010-12-31 NOTE — Telephone Encounter (Signed)
Does this come from [place] or [place]?

## 2011-01-02 NOTE — Telephone Encounter (Signed)
I went ahead and sent it in

## 2011-01-06 ENCOUNTER — Ambulatory Visit (INDEPENDENT_AMBULATORY_CARE_PROVIDER_SITE_OTHER): Payer: Medicare Other | Admitting: *Deleted

## 2011-01-06 ENCOUNTER — Ambulatory Visit: Payer: Self-pay | Admitting: Pharmacist

## 2011-01-06 DIAGNOSIS — E785 Hyperlipidemia, unspecified: Secondary | ICD-10-CM

## 2011-01-06 DIAGNOSIS — Z952 Presence of prosthetic heart valve: Secondary | ICD-10-CM

## 2011-01-06 DIAGNOSIS — I2510 Atherosclerotic heart disease of native coronary artery without angina pectoris: Secondary | ICD-10-CM

## 2011-01-06 DIAGNOSIS — Z79899 Other long term (current) drug therapy: Secondary | ICD-10-CM

## 2011-01-06 DIAGNOSIS — I359 Nonrheumatic aortic valve disorder, unspecified: Secondary | ICD-10-CM

## 2011-01-07 LAB — BASIC METABOLIC PANEL
BUN/Creatinine Ratio: 11 (ref 10–22)
BUN: 18 mg/dL (ref 8–27)
CO2: 23 mmol/L (ref 20–32)
Chloride: 104 mmol/L (ref 97–108)
Potassium: 4.6 mmol/L (ref 3.5–5.2)

## 2011-01-07 LAB — LIPID PANEL
HDL: 50 mg/dL (ref >39)
Triglycerides: 119 mg/dL (ref 0–149)
VLDL Cholesterol Cal: 24 mg/dL (ref 5–40)

## 2011-01-10 ENCOUNTER — Telehealth: Payer: Self-pay | Admitting: *Deleted

## 2011-01-10 DIAGNOSIS — E7800 Pure hypercholesterolemia, unspecified: Secondary | ICD-10-CM

## 2011-01-10 DIAGNOSIS — I2510 Atherosclerotic heart disease of native coronary artery without angina pectoris: Secondary | ICD-10-CM

## 2011-01-10 MED ORDER — AMLODIPINE BESYLATE 5 MG PO TABS: 5.0000 mg | ORAL_TABLET | Freq: Every day | ORAL | Status: DC

## 2011-01-10 MED ORDER — ROSUVASTATIN CALCIUM 20 MG PO TABS: 20.0000 mg | ORAL_TABLET | Freq: Every day | ORAL | Status: DC

## 2011-01-10 NOTE — Telephone Encounter (Signed)
Pt will incr crestor to 20mg  qd, stop HCTZ, and start Amlodipine 5mg  qd. Pt will have BMP repeated in 2 weeks and Lipid/LFT in 2 months.

## 2011-01-10 NOTE — Telephone Encounter (Signed)
Message copied by Annia Belt on Mon Jan 10, 2011  3:47 PM ------      Message from: Laurey Morale      Created: Sun Jan 09, 2011 10:53 PM       LDL is too high, goal < 70.  Increase Crestor to 20 mg daily with lipids/LFTs in 2 months.  Creatinine is elevated after starting HCTZ.  Would have him stop HCTZ and take amlodipine 5 mg daily instead.  Repeat BMET in 2 wks.

## 2011-01-28 ENCOUNTER — Ambulatory Visit (INDEPENDENT_AMBULATORY_CARE_PROVIDER_SITE_OTHER): Payer: Medicare Other | Admitting: *Deleted

## 2011-01-28 DIAGNOSIS — I2510 Atherosclerotic heart disease of native coronary artery without angina pectoris: Secondary | ICD-10-CM

## 2011-01-29 LAB — BASIC METABOLIC PANEL
BUN/Creatinine Ratio: 13 (ref 10–22)
BUN: 17 mg/dL (ref 8–27)
CO2: 24 mmol/L (ref 20–32)
Chloride: 109 mmol/L — ABNORMAL HIGH (ref 97–108)
GFR calc Af Amer: 63 mL/min/{1.73_m2} (ref >59)
Potassium: 4.7 mmol/L (ref 3.5–5.2)
Sodium: 145 mmol/L — ABNORMAL HIGH (ref 134–144)

## 2011-03-01 ENCOUNTER — Ambulatory Visit (INDEPENDENT_AMBULATORY_CARE_PROVIDER_SITE_OTHER): Payer: Medicare Other | Admitting: *Deleted

## 2011-03-01 DIAGNOSIS — E7800 Pure hypercholesterolemia, unspecified: Secondary | ICD-10-CM

## 2011-03-02 LAB — HEPATIC FUNCTION PANEL
AST: 16 IU/L (ref 0–40)
Albumin: 4.2 g/dL (ref 3.6–4.8)
Alkaline Phosphatase: 69 IU/L (ref 25–160)
Total Bilirubin: 0.7 mg/dL (ref 0.0–1.2)

## 2011-03-02 LAB — LIPID PANEL
Cholesterol, Total: 149 mg/dL (ref 100–199)
VLDL Cholesterol Cal: 25 mg/dL (ref 5–40)

## 2011-03-28 ENCOUNTER — Other Ambulatory Visit: Payer: Self-pay | Admitting: *Deleted

## 2011-03-28 NOTE — Telephone Encounter (Signed)
Faxed refill request   

## 2011-03-29 ENCOUNTER — Encounter: Payer: Self-pay | Admitting: Cardiology

## 2011-03-29 ENCOUNTER — Ambulatory Visit (INDEPENDENT_AMBULATORY_CARE_PROVIDER_SITE_OTHER): Payer: Medicare Other | Admitting: Cardiology

## 2011-03-29 VITALS — BP 112/66 | HR 68 | Ht 70.0 in | Wt 186.0 lb

## 2011-03-29 DIAGNOSIS — I359 Nonrheumatic aortic valve disorder, unspecified: Secondary | ICD-10-CM

## 2011-03-29 DIAGNOSIS — I4891 Unspecified atrial fibrillation: Secondary | ICD-10-CM

## 2011-03-29 DIAGNOSIS — I10 Essential (primary) hypertension: Secondary | ICD-10-CM

## 2011-03-29 DIAGNOSIS — E7800 Pure hypercholesterolemia, unspecified: Secondary | ICD-10-CM

## 2011-03-29 DIAGNOSIS — K439 Ventral hernia without obstruction or gangrene: Secondary | ICD-10-CM

## 2011-03-29 DIAGNOSIS — I2510 Atherosclerotic heart disease of native coronary artery without angina pectoris: Secondary | ICD-10-CM

## 2011-03-29 MED ORDER — ALPRAZOLAM 0.25 MG PO TABS: 0.2500 mg | ORAL_TABLET | Freq: Every evening | ORAL | Status: DC | PRN

## 2011-03-29 NOTE — Telephone Encounter (Signed)
Needs to schedule a CPE for spring.  Please call in.

## 2011-03-29 NOTE — Telephone Encounter (Signed)
Medication phoned to pharmacy.  Pt advised to schedule CPE this Spring.

## 2011-03-30 ENCOUNTER — Telehealth: Payer: Self-pay | Admitting: *Deleted

## 2011-03-30 DIAGNOSIS — K439 Ventral hernia without obstruction or gangrene: Secondary | ICD-10-CM | POA: Insufficient documentation

## 2011-03-30 NOTE — Assessment & Plan Note (Signed)
BP is under good control.  

## 2011-03-30 NOTE — Assessment & Plan Note (Signed)
Bioprosthetic aortic valve, well-seated on last echo. 
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
## 2011-03-30 NOTE — Assessment & Plan Note (Signed)
At inferior end of sternotomy scar.  It is larger than it was in the past.  The hernia is non-painful and reducible. He wants a surgical evaluation, so I will send him to [facility].  I suspect it can be watched.

## 2011-03-30 NOTE — Assessment & Plan Note (Signed)
No chest pain.  Patient had SVG-OM and SVG-D at the time of his AVR.  He will continue ASA, statin, and Coreg.   

## 2011-03-30 NOTE — Assessment & Plan Note (Signed)
Goal LDL < 70.  Repeat lipids prior to next appointment in 6 months.

## 2011-03-30 NOTE — Assessment & Plan Note (Signed)
Only noted post-op.  He is not anticoagulated.  

## 2011-03-30 NOTE — Telephone Encounter (Signed)
Pt scheduled to see Dr. Dwain Sarna at [facility] for ventral hernia eval per Dr. Shirlee Latch. Pt notified of appt 04/14/11 at 0845. He knows to call their office if he needs to change appt.

## 2011-04-14 ENCOUNTER — Ambulatory Visit (INDEPENDENT_AMBULATORY_CARE_PROVIDER_SITE_OTHER): Payer: Medicare Other | Admitting: General Surgery

## 2011-04-14 ENCOUNTER — Encounter (INDEPENDENT_AMBULATORY_CARE_PROVIDER_SITE_OTHER): Payer: Self-pay | Admitting: General Surgery

## 2011-04-14 VITALS — BP 132/70 | HR 68 | Temp 97.9°F | Resp 18 | Ht 70.0 in | Wt 187.6 lb

## 2011-04-14 DIAGNOSIS — K439 Ventral hernia without obstruction or gangrene: Secondary | ICD-10-CM
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
## 2011-05-10 ENCOUNTER — Other Ambulatory Visit (INDEPENDENT_AMBULATORY_CARE_PROVIDER_SITE_OTHER): Payer: Self-pay | Admitting: General Surgery

## 2011-05-23 ENCOUNTER — Encounter (HOSPITAL_COMMUNITY): Payer: Self-pay | Admitting: Pharmacy Technician

## 2011-05-24 ENCOUNTER — Encounter (HOSPITAL_COMMUNITY): Payer: Self-pay

## 2011-05-24 ENCOUNTER — Ambulatory Visit (HOSPITAL_COMMUNITY)
Admission: RE | Admit: 2011-05-24 | Discharge: 2011-05-24 | Disposition: A | Discharge status: Home / Self Care | Payer: Medicare Other | Source: Ambulatory Visit | Attending: General Surgery | Admitting: General Surgery

## 2011-05-24 ENCOUNTER — Encounter (HOSPITAL_COMMUNITY)
Admission: RE | Admit: 2011-05-24 | Discharge: 2011-05-24 | Disposition: A | Discharge status: Home / Self Care | Payer: Medicare Other | Source: Ambulatory Visit | Attending: General Surgery | Admitting: General Surgery

## 2011-05-24 ENCOUNTER — Telehealth (INDEPENDENT_AMBULATORY_CARE_PROVIDER_SITE_OTHER): Payer: Self-pay

## 2011-05-24 DIAGNOSIS — Z01818 Encounter for other preprocedural examination: Secondary | ICD-10-CM | POA: Insufficient documentation

## 2011-05-24 DIAGNOSIS — K439 Ventral hernia without obstruction or gangrene: Secondary | ICD-10-CM | POA: Insufficient documentation

## 2011-05-24 DIAGNOSIS — Z01812 Encounter for preprocedural laboratory examination: Secondary | ICD-10-CM | POA: Insufficient documentation

## 2011-05-24 DIAGNOSIS — Z951 Presence of aortocoronary bypass graft: Secondary | ICD-10-CM | POA: Insufficient documentation

## 2011-05-24 HISTORY — DX: Adverse effect of unspecified anesthetic, initial encounter: T41.45XA

## 2011-05-24 HISTORY — DX: Other complications of anesthesia, initial encounter: T88.59XA

## 2011-05-24 LAB — CBC
HCT: 38.1 % — ABNORMAL LOW (ref 39.0–52.0)
Hemoglobin: 13.2 g/dL (ref 13.0–17.0)
MCH: 30.1 pg (ref 26.0–34.0)
MCV: 86.8 fL (ref 78.0–100.0)
RBC: 4.39 MIL/uL (ref 4.22–5.81)
WBC: 6.1 10*3/uL (ref 4.0–10.5)

## 2011-05-24 LAB — BASIC METABOLIC PANEL
CO2: 26 mEq/L (ref 19–32)
Calcium: 9.6 mg/dL (ref 8.4–10.5)
Chloride: 106 mEq/L (ref 96–112)
Glucose, Bld: 95 mg/dL (ref 70–99)
Sodium: 138 mEq/L (ref 135–145)

## 2011-05-24 NOTE — Pre-Procedure Instructions (Signed)
05/24/11 Helmut Muster called from Dr Dwain Sarna office and stated Dr Dwain Sarna aware of labs. Labs ok for surgery per Helmut Muster per Dr Dwain Sarna.

## 2011-05-24 NOTE — Pre-Procedure Instructions (Signed)
05/24/11 Left voice mail message regarding abnormal Creatinine of 1.37 and platelet count of 127 on preop lab results done today.  Left message for Helmut Muster Dr Dwain Sarna nurse and asked her to return call.

## 2011-05-24 NOTE — Pre-Procedure Instructions (Signed)
LOV with cardiologist ( Dr Marca Ancona ) 03/29/11 EPIC  03/29/11 EKG EPIC  04/22/10 Stress EPIC  4/12 Echo EPIC CXR 05/24/11 EPIC

## 2011-05-24 NOTE — Patient Instructions (Signed)
21 [name]  05/24/2011   Your procedure is scheduled on:  06/23/11 0830-1100am  Report to [facility] at 0630 AM.  Call this number if you have problems the morning of surgery: [phone]   Remember:   Do not eat food:After Midnight.  May have clear liquids:until Midnight .    Take these medicines the morning of surgery with A SIP OF WATER:   Do not wear jewelry  Do not wear lotions, powders, or perfumes.    Do not bring valuables to the hospital.  Contacts, dentures or bridgework may not be worn into surgery.  Leave suitcase in the car. After surgery it may be brought to your room.  For patients admitted to the hospital, checkout time is 11:00 AM the day of discharge.       Special Instructions: CHG Shower Use Special Wash: 1/2 bottle night before surgery and 1/2 bottle morning of surgery. shower chin to toes with CHG.  Wash face and private parts with reguilar soap.     Please read over the following fact sheets that you were given: MRSA Information, coughing and deep breathing exercises, leg exercises

## 2011-05-24 NOTE — Pre-Procedure Instructions (Signed)
05/24/11 Pt denies any hxof shingles at this visit.

## 2011-05-24 NOTE — Telephone Encounter (Signed)
Albin Felling calling to notify that pt had preop labs drawn today and the Creatnine 1.37 along with the Platelets of 127. The pt is scheduled for surgery on 06-23-11 lap ventral hernia. Pls advise on the abnormal labs.

## 2011-05-24 NOTE — Pre-Procedure Instructions (Signed)
05/24/11 Spoke with French Ana at [facility] .  Helmut Muster ( Dr Doreen Salvage nurse with pts) and told her to give Helmut Muster a message regarding abnormal lab results on patient.  She stated she would let her know. I told her I had already left a voice mail message with results earlier in day.

## 2011-05-24 NOTE — Telephone Encounter (Signed)
They are fine

## 2011-05-24 NOTE — Pre-Procedure Instructions (Signed)
05/24/11 Left pt a message on home phone regarding positive pcr screen for staph.  Instructed pt to call nurse back so I know he received message.

## 2011-06-01 ENCOUNTER — Other Ambulatory Visit: Payer: Self-pay | Admitting: *Deleted

## 2011-06-01 MED ORDER — FINASTERIDE 5 MG PO TABS: 5.0000 mg | ORAL_TABLET | Freq: Every day | ORAL | Status: DC

## 2011-06-01 NOTE — Telephone Encounter (Signed)
Sent!

## 2011-06-01 NOTE — Telephone Encounter (Signed)
Patient is requesting a Rx refill, he has an appt scheduled for 08/02/2011.

## 2011-06-13 ENCOUNTER — Encounter (HOSPITAL_COMMUNITY): Payer: Self-pay | Admitting: Pharmacy Technician

## 2011-06-21 ENCOUNTER — Other Ambulatory Visit: Payer: Self-pay | Admitting: Cardiology

## 2011-06-23 ENCOUNTER — Encounter (HOSPITAL_COMMUNITY): Payer: Self-pay | Admitting: Anesthesiology

## 2011-06-23 ENCOUNTER — Encounter (HOSPITAL_COMMUNITY)
Admission: RE | Disposition: A | Discharge status: Home / Self Care | Payer: Self-pay | Source: Ambulatory Visit | Attending: General Surgery

## 2011-06-23 ENCOUNTER — Encounter (HOSPITAL_COMMUNITY): Payer: Self-pay | Admitting: *Deleted

## 2011-06-23 ENCOUNTER — Ambulatory Visit (HOSPITAL_COMMUNITY): Payer: Medicare Other | Admitting: Anesthesiology

## 2011-06-23 ENCOUNTER — Inpatient Hospital Stay (HOSPITAL_COMMUNITY)
Admission: RE | Admit: 2011-06-23 | Discharge: 2011-06-27 | DRG: 355 | Disposition: A | Discharge status: Home / Self Care | Payer: Medicare Other | Source: Ambulatory Visit | Attending: General Surgery | Admitting: General Surgery

## 2011-06-23 ENCOUNTER — Encounter (HOSPITAL_COMMUNITY): Payer: Self-pay

## 2011-06-23 DIAGNOSIS — I739 Peripheral vascular disease, unspecified: Secondary | ICD-10-CM | POA: Diagnosis present

## 2011-06-23 DIAGNOSIS — E7800 Pure hypercholesterolemia, unspecified: Secondary | ICD-10-CM | POA: Diagnosis present

## 2011-06-23 DIAGNOSIS — K432 Incisional hernia without obstruction or gangrene: Secondary | ICD-10-CM

## 2011-06-23 DIAGNOSIS — Z952 Presence of prosthetic heart valve: Secondary | ICD-10-CM

## 2011-06-23 DIAGNOSIS — I10 Essential (primary) hypertension: Secondary | ICD-10-CM | POA: Diagnosis present

## 2011-06-23 DIAGNOSIS — K439 Ventral hernia without obstruction or gangrene: Principal | ICD-10-CM | POA: Diagnosis present

## 2011-06-23 DIAGNOSIS — R011 Cardiac murmur, unspecified: Secondary | ICD-10-CM | POA: Diagnosis present

## 2011-06-23 DIAGNOSIS — Z951 Presence of aortocoronary bypass graft: Secondary | ICD-10-CM

## 2011-06-23 DIAGNOSIS — N400 Enlarged prostate without lower urinary tract symptoms: Secondary | ICD-10-CM | POA: Diagnosis present

## 2011-06-23 DIAGNOSIS — D696 Thrombocytopenia, unspecified: Secondary | ICD-10-CM | POA: Diagnosis not present

## 2011-06-23 DIAGNOSIS — I2510 Atherosclerotic heart disease of native coronary artery without angina pectoris: Secondary | ICD-10-CM | POA: Diagnosis present

## 2011-06-23 HISTORY — PX: VENTRAL HERNIA REPAIR: SHX424

## 2011-06-23 SURGERY — REPAIR, HERNIA, VENTRAL, LAPAROSCOPIC
Anesthesia: General | Site: Abdomen

## 2011-06-23 MED ORDER — LIDOCAINE HCL (CARDIAC) 20 MG/ML IV SOLN
INTRAVENOUS | Status: DC | PRN
  Administered 2011-06-23: 40 mg via INTRAVENOUS

## 2011-06-23 MED ORDER — FINASTERIDE 5 MG PO TABS
5.0000 mg | ORAL_TABLET | Freq: Every day | ORAL | Status: DC
  Administered 2011-06-23 – 2011-06-26 (×4): 5 mg via ORAL
  Filled 2011-06-23 (×5): qty 1

## 2011-06-23 MED ORDER — AMLODIPINE BESYLATE 5 MG PO TABS
5.0000 mg | ORAL_TABLET | Freq: Every evening | ORAL | Status: DC
  Administered 2011-06-23 – 2011-06-26 (×4): 5 mg via ORAL
  Filled 2011-06-23 (×5): qty 1

## 2011-06-23 MED ORDER — LACTATED RINGERS IV SOLN
INTRAVENOUS | Status: DC
  Administered 2011-06-23: 09:00:00 via INTRAVENOUS
  Administered 2011-06-23: 1000 mL via INTRAVENOUS

## 2011-06-23 MED ORDER — ROCURONIUM BROMIDE 100 MG/10ML IV SOLN
INTRAVENOUS | Status: DC | PRN
  Administered 2011-06-23: 50 mg via INTRAVENOUS

## 2011-06-23 MED ORDER — PHENYLEPHRINE HCL 10 MG/ML IJ SOLN
INTRAMUSCULAR | Status: DC | PRN
  Administered 2011-06-23: 160 ug via INTRAVENOUS
  Administered 2011-06-23: 80 ug via INTRAVENOUS
  Administered 2011-06-23: 160 ug via INTRAVENOUS
  Administered 2011-06-23: 120 ug via INTRAVENOUS
  Administered 2011-06-23: 80 ug via INTRAVENOUS

## 2011-06-23 MED ORDER — CARVEDILOL 6.25 MG PO TABS
6.2500 mg | ORAL_TABLET | Freq: Two times a day (BID) | ORAL | Status: DC
  Administered 2011-06-23 – 2011-06-27 (×8): 6.25 mg via ORAL
  Filled 2011-06-23 (×10): qty 1

## 2011-06-23 MED ORDER — ONDANSETRON HCL 4 MG/2ML IJ SOLN
INTRAMUSCULAR | Status: DC | PRN
  Administered 2011-06-23: 4 mg via INTRAVENOUS

## 2011-06-23 MED ORDER — PROPOFOL 10 MG/ML IV EMUL
INTRAVENOUS | Status: DC | PRN
  Administered 2011-06-23: 200 mg via INTRAVENOUS

## 2011-06-23 MED ORDER — CEFAZOLIN SODIUM-DEXTROSE 2-3 GM-% IV SOLR
INTRAVENOUS | Status: AC
  Filled 2011-06-23: qty 50

## 2011-06-23 MED ORDER — NEOSTIGMINE METHYLSULFATE 1 MG/ML IJ SOLN
INTRAMUSCULAR | Status: DC | PRN
  Administered 2011-06-23: 3 mg via INTRAVENOUS

## 2011-06-23 MED ORDER — BUPIVACAINE-EPINEPHRINE 0.25% -1:200000 IJ SOLN
INTRAMUSCULAR | Status: AC
  Filled 2011-06-23: qty 1

## 2011-06-23 MED ORDER — HETASTARCH-ELECTROLYTES 6 % IV SOLN
INTRAVENOUS | Status: DC | PRN
  Administered 2011-06-23: 10:00:00 via INTRAVENOUS

## 2011-06-23 MED ORDER — MORPHINE SULFATE 2 MG/ML IJ SOLN
2.0000 mg | INTRAMUSCULAR | Status: DC | PRN
  Administered 2011-06-23 – 2011-06-24 (×4): 2 mg via INTRAVENOUS
  Filled 2011-06-23 (×4): qty 1

## 2011-06-23 MED ORDER — PROMETHAZINE HCL 25 MG/ML IJ SOLN: 6.2500 mg | INTRAMUSCULAR | Status: DC | PRN

## 2011-06-23 MED ORDER — HYDROMORPHONE HCL PF 1 MG/ML IJ SOLN
INTRAMUSCULAR | Status: DC | PRN
  Administered 2011-06-23: 1 mg via INTRAVENOUS

## 2011-06-23 MED ORDER — MIDAZOLAM HCL 5 MG/5ML IJ SOLN
INTRAMUSCULAR | Status: DC | PRN
  Administered 2011-06-23: 2 mg via INTRAVENOUS

## 2011-06-23 MED ORDER — CEFAZOLIN SODIUM-DEXTROSE 2-3 GM-% IV SOLR
2.0000 g | INTRAVENOUS | Status: AC
  Administered 2011-06-23: 2 g via INTRAVENOUS

## 2011-06-23 MED ORDER — ONDANSETRON HCL 4 MG/2ML IJ SOLN: 4.0000 mg | Freq: Four times a day (QID) | INTRAMUSCULAR | Status: DC | PRN

## 2011-06-23 MED ORDER — BUPIVACAINE-EPINEPHRINE 0.25% -1:200000 IJ SOLN
INTRAMUSCULAR | Status: DC | PRN
  Administered 2011-06-23: 12 mL

## 2011-06-23 MED ORDER — ACETAMINOPHEN 650 MG RE SUPP: 650.0000 mg | Freq: Four times a day (QID) | RECTAL | Status: DC | PRN

## 2011-06-23 MED ORDER — ACETAMINOPHEN 325 MG PO TABS: 650.0000 mg | ORAL_TABLET | Freq: Four times a day (QID) | ORAL | Status: DC | PRN

## 2011-06-23 MED ORDER — GLYCOPYRROLATE 0.2 MG/ML IJ SOLN
INTRAMUSCULAR | Status: DC | PRN
  Administered 2011-06-23: 0.4 mg via INTRAVENOUS

## 2011-06-23 MED ORDER — DOCUSATE SODIUM 100 MG PO CAPS
100.0000 mg | ORAL_CAPSULE | Freq: Two times a day (BID) | ORAL | Status: DC
  Administered 2011-06-23 – 2011-06-26 (×8): 100 mg via ORAL
  Filled 2011-06-23 (×10): qty 1

## 2011-06-23 MED ORDER — ACETAMINOPHEN 10 MG/ML IV SOLN
INTRAVENOUS | Status: DC | PRN
  Administered 2011-06-23: 1000 mg via INTRAVENOUS

## 2011-06-23 MED ORDER — PANTOPRAZOLE SODIUM 40 MG IV SOLR
40.0000 mg | Freq: Every day | INTRAVENOUS | Status: DC
  Administered 2011-06-23 – 2011-06-24 (×2): 40 mg via INTRAVENOUS
  Filled 2011-06-23 (×3): qty 40

## 2011-06-23 MED ORDER — FENTANYL CITRATE 0.05 MG/ML IJ SOLN: 25.0000 ug | INTRAMUSCULAR | Status: DC | PRN

## 2011-06-23 MED ORDER — LIDOCAINE HCL 4 % MT SOLN
OROMUCOSAL | Status: DC | PRN
  Administered 2011-06-23: 4 mL via TOPICAL

## 2011-06-23 MED ORDER — SODIUM CHLORIDE 0.9 % IV SOLN
INTRAVENOUS | Status: DC
  Administered 2011-06-23 – 2011-06-25 (×5): via INTRAVENOUS

## 2011-06-23 MED ORDER — ACETAMINOPHEN 10 MG/ML IV SOLN
INTRAVENOUS | Status: AC
  Filled 2011-06-23: qty 100

## 2011-06-23 MED ORDER — SUFENTANIL CITRATE 50 MCG/ML IV SOLN
INTRAVENOUS | Status: DC | PRN
  Administered 2011-06-23 (×4): 10 ug via INTRAVENOUS
  Administered 2011-06-23: 5 ug via INTRAVENOUS

## 2011-06-23 MED ORDER — LACTATED RINGERS IV SOLN: INTRAVENOUS | Status: DC

## 2011-06-23 MED ORDER — ALPRAZOLAM 0.25 MG PO TABS: 0.2500 mg | ORAL_TABLET | Freq: Every evening | ORAL | Status: DC | PRN

## 2011-06-23 MED ORDER — OXYCODONE HCL 5 MG PO TABS: 5.0000 mg | ORAL_TABLET | ORAL | Status: DC | PRN

## 2011-06-23 SURGICAL SUPPLY — 48 items
ADH SKN CLS APL DERMABOND .7 (GAUZE/BANDAGES/DRESSINGS) ×1
BINDER ABD UNIV 12 45-62 (WOUND CARE) IMPLANT
BINDER ABDOMINAL 46IN 62IN (WOUND CARE) ×2
BLADE SURG SZ11 CARB STEEL (BLADE) ×2 IMPLANT
CABLE HIGH FREQUENCY MONO STRZ (ELECTRODE) ×2 IMPLANT
CANISTER SUCTION 2500CC (MISCELLANEOUS) ×2 IMPLANT
CLOTH BEACON ORANGE TIMEOUT ST (SAFETY) ×2 IMPLANT
DECANTER SPIKE VIAL GLASS SM (MISCELLANEOUS) ×2 IMPLANT
DERMABOND ADVANCED (GAUZE/BANDAGES/DRESSINGS) ×1
DERMABOND ADVANCED .7 DNX12 (GAUZE/BANDAGES/DRESSINGS) ×1 IMPLANT
DEVICE SECURE STRAP 25 ABSORB (INSTRUMENTS) ×2 IMPLANT
DEVICE TROCAR PUNCTURE CLOSURE (ENDOMECHANICALS) ×1 IMPLANT
DRAPE INCISE IOBAN 66X45 STRL (DRAPES) ×2 IMPLANT
DRAPE LAPAROSCOPIC ABDOMINAL (DRAPES) ×2 IMPLANT
ELECT REM PT RETURN 9FT ADLT (ELECTROSURGICAL) ×2
ELECTRODE REM PT RTRN 9FT ADLT (ELECTROSURGICAL) ×1 IMPLANT
GLOVE BIO SURGEON STRL SZ7 (GLOVE) ×3 IMPLANT
GLOVE BIOGEL PI IND STRL 7.0 (GLOVE) ×1 IMPLANT
GLOVE BIOGEL PI IND STRL 7.5 (GLOVE) ×1 IMPLANT
GLOVE BIOGEL PI INDICATOR 7.0 (GLOVE) ×1
GLOVE BIOGEL PI INDICATOR 7.5 (GLOVE) ×2
GOWN PREVENTION PLUS LG XLONG (DISPOSABLE) ×2 IMPLANT
GOWN PREVENTION PLUS XLARGE (GOWN DISPOSABLE) ×2 IMPLANT
GOWN STRL NON-REIN LRG LVL3 (GOWN DISPOSABLE) ×3 IMPLANT
GOWN STRL REIN XL XLG (GOWN DISPOSABLE) ×2 IMPLANT
KIT BASIN OR (CUSTOM PROCEDURE TRAY) ×2 IMPLANT
NDL SPNL 22GX3.5 QUINCKE BK (NEEDLE) ×1 IMPLANT
NEEDLE SPNL 22GX3.5 QUINCKE BK (NEEDLE) ×2 IMPLANT
NS IRRIG 1000ML POUR BTL (IV SOLUTION) ×2 IMPLANT
PEN SKIN MARKING BROAD (MISCELLANEOUS) ×2 IMPLANT
SCALPEL HARMONIC ACE (MISCELLANEOUS) ×1 IMPLANT
SCISSORS LAP 5X35 DISP (ENDOMECHANICALS) ×2 IMPLANT
SET IRRIG TUBING LAPAROSCOPIC (IRRIGATION / IRRIGATOR) ×1 IMPLANT
SOLUTION ANTI FOG 6CC (MISCELLANEOUS) ×2 IMPLANT
STAPLER VISISTAT 35W (STAPLE) ×2 IMPLANT
STRIP CLOSURE SKIN 1/2X4 (GAUZE/BANDAGES/DRESSINGS) IMPLANT
SUT MNCRL AB 4-0 PS2 18 (SUTURE) ×2 IMPLANT
SUT PROLENE 0 CT 1 CR/8 (SUTURE) ×2 IMPLANT
SUT PROLENE 2 0 SH DA (SUTURE) IMPLANT
TACKER 5MM HERNIA 3.5CML NAB (ENDOMECHANICALS) ×1 IMPLANT
TOWEL OR 17X26 10 PK STRL BLUE (TOWEL DISPOSABLE) ×2 IMPLANT
TRAY FOLEY CATH 14FRSI W/METER (CATHETERS) ×2 IMPLANT
TRAY LAP CHOLE (CUSTOM PROCEDURE TRAY) ×2 IMPLANT
TROCAR BALLN 12MMX100 BLUNT (TROCAR) ×1 IMPLANT
TROCAR BLADELESS OPT 5 75 (ENDOMECHANICALS) ×4 IMPLANT
TROCAR XCEL NON-BLD 11X100MML (ENDOMECHANICALS) ×2 IMPLANT
TUBING INSUFFLATION 10FT LAP (TUBING) ×2 IMPLANT
ventralight (Mesh General) ×1 IMPLANT

## 2011-06-23 NOTE — Op Note (Signed)
Preoperative diagnosis: Subxiphoid incisional hernia Postoperative diagnosis: Same as above Procedure: Laparoscopic ventral hernia repair with 17 x 25 cm ventralite mesh Surgeon: Dr. Harden Mo Anesthesia: Gen. Endotracheal Complications: None Specimens: None Estimated blood loss: 50 cc Sponge and needle count correct x2 and the operation Disposition to recovery in stable condition  Indications: This is a 71 year old male who underwent a coronary artery bypass and bioprosthetic aortic valve placement. This was complicated by pericarditis. He has done well since then and has been managed by his cardiologist Dr. Shirlee Latch. He then developed an upper abdominal bulge and it is bothering him during exercise. He and I discussed the options including observation for this and we decided to attempt to perform a laparoscopic ventral hernia repair for this defect. We discussed the risks benefits as well as the long-term outcomes from a laparoscopic repair versus an open repair of this defect.  Procedure:After informed consent was obtained the patient was taken to the operating room. He was administered 2 grams of intravenous cefazolin. Sequential compression devices were placed on his legs prior to beginning. He was then placed under general anesthesia without complication. A Foley catheter and orogastric tube were placed. His abdomen was then prepped and draped in the standard sterile surgical fashion. A surgical timeout was then performed. [name] was placed overlying the field. I infiltrated .25% Marcaine in theleft upper quadrant and then accessed his peritoneum with a 5 mm Optiview trocar. There was no entry injury. I then placed 2 further 5 mm trocars in similar fashion the right lower quadrant as well as a 10 mm in the left lower quadrant. The defect was then noted to be about 3 x 3 cm and it was in the subxiphoid position. I then used a Harmonic scalpel and took down the falciform ligament in its entirety.  I measured the defect again and it did appear to be about 3 x 3 cm. I wanted to get plenty of overlap superiorly as well as cover his entire incision so I did choose to use a 17 x 25 cm mash. I placed stitches in the 3, 9:00, and 6:00 position and this mesh. I then rolled the mesh and inserted through the 10 mm trocar. This was laid flat. I used a suture passer to pull up the Prolene sutures in these positions and to attach it to the abdominal wall. This appeared to be in good position I used then used to secure-strap tacker to adhere this to the abdominal wall. At the superior margin I did tack this to the costal margin. I had about 4 cm of overlap there is well. At this point I ended up placing about 10 transfascial 0 Prolene sutures through the top portion of the mesh surrounding the costal margin to ensure that I had the mesh fixed appropriately. These were done without difficulty and without complication. Upon completion the mesh appeared to cover the defect completely and I have trans-fascial fixation especially superiorly near his costal margin. I felt I had discovered adequately. There was no evidence of any bleeding. I then removed the 10 mm trocar and I closed this with a 0 Vicryl suture using the suture passer. I then removed all trocars and desufflated the abdomen. The incisions were closed with 4 Monocryl and Dermabond. A abdominal binder was then placed. He tolerated this well was extubated and transferred to recovery in stable condition.
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
## 2011-06-23 NOTE — Interval H&P Note (Signed)
History and Physical Interval Note:  06/23/2011 8:37 AM  [name]  has presented today for surgery, with the diagnosis of ventral hernia  The various methods of treatment have been discussed with the patient and family. After consideration of risks, benefits and other options for treatment, the patient has consented to  Procedure(s) (LRB): LAPAROSCOPIC VENTRAL HERNIA (N/A) INSERTION OF MESH (N/A) as a surgical intervention .  The patients' history has been reviewed, patient examined, no change in status, stable for surgery.  I have reviewed the patients' chart and labs.  Questions were answered to the patient's satisfaction.     Verline Kong

## 2011-06-23 NOTE — Anesthesia Preprocedure Evaluation (Addendum)
Anesthesia Evaluation  Patient identified by MRN, date of birth, ID band Patient awake    Reviewed: Allergy & Precautions, H&P , NPO status , Patient's Chart, lab work & pertinent test results  Airway Mallampati: II TM Distance: >3 FB Neck ROM: Full    Dental  (+) Teeth Intact and Dental Advisory Given   Pulmonary neg pulmonary ROS,  breath sounds clear to auscultation  Pulmonary exam normal       Cardiovascular hypertension, Pt. 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, Rx for CHF  2D ECHO 06/2010 with EF 55%   Neuro/Psych Anxiety negative neurological ROS  negative psych ROS   GI/Hepatic negative GI ROS, Neg liver ROS,   Endo/Other  negative endocrine ROS  Renal/GU negative Renal ROS   BPH    Musculoskeletal negative musculoskeletal ROS (+)   Abdominal   Peds  Hematology negative hematology ROS (+)   Anesthesia Other Findings   Reproductive/Obstetrics negative OB ROS                        Anesthesia Physical Anesthesia Plan  ASA: III  Anesthesia Plan: General   Post-op Pain Management:    Induction: Intravenous  Airway Management Planned: Oral ETT  Additional Equipment:   Intra-op Plan:   Post-operative Plan: Extubation in OR  Informed Consent: I have reviewed the patients History and Physical, chart, labs and discussed the procedure including the risks, benefits and alternatives for the proposed anesthesia with the patient or authorized representative who has indicated his/her understanding and acceptance.   Dental advisory given  Plan Discussed with: CRNA  Anesthesia Plan Comments:         Anesthesia Quick Evaluation

## 2011-06-23 NOTE — Anesthesia Postprocedure Evaluation (Signed)
Anesthesia Post Note  Patient: [name]  Procedure(s) Performed: Procedure(s) (LRB): LAPAROSCOPIC VENTRAL HERNIA (N/A) INSERTION OF MESH (N/A)  Anesthesia type: General  Patient location: PACU  Post pain: Pain level controlled  Post assessment: Post-op Vital signs reviewed  Last Vitals:  Filed Vitals:   06/23/11 1115  BP: 137/62  Pulse: 70  Temp: 36.8 C  Resp: 13    Post vital signs: Reviewed  Level of consciousness: sedated  Complications: No apparent anesthesia complications

## 2011-06-23 NOTE — Discharge Instructions (Signed)
[facility] LAPAROSCOPIC SURGERY: POST OP INSTRUCTIONS  Always review your discharge instruction sheet given to you by the facility where your surgery was performed. IF YOU HAVE DISABILITY OR FAMILY LEAVE FORMS, YOU MUST BRING THEM TO THE OFFICE FOR PROCESSING.   DO NOT GIVE THEM TO YOUR DOCTOR.  1. A prescription for pain medication may be given to you upon discharge.  Take your pain medication as prescribed, if needed.  If narcotic pain medicine is not needed, then you may take acetaminophen (Tylenol), naprosyn (Alleve), or ibuprofen (Advil) as needed. 2. Take your usually prescribed medications unless otherwise directed. 3. If you need a refill on your pain medication, please contact your pharmacy.  They will contact our office to request authorization. Prescriptions will not be filled after 5pm or on week-ends. 4. You should follow a light diet the first few days after arrival home, such as soup and crackers, etc.  Be sure to include lots of fluids daily. 5. Most patients will experience some swelling and bruising in the area of the incisions.  Ice packs will help.  Swelling and bruising can take several days to resolve.  6. It is common to experience some constipation if taking pain medication after surgery.  Increasing fluid intake and taking a stool softener (such as Colace) will usually help or prevent this problem from occurring.  A mild laxative (Milk of Magnesia or Miralax) should be taken according to package instructions if there are no bowel movements after 48 hours. 7. Unless discharge instructions indicate otherwise, you may remove your bandages 48 hours after surgery, and you may shower at that time.  You may have steri-strips (small skin tapes) in place directly over the incision.  These strips should be left on the skin for 7-10 days.  If your surgeon used skin glue on the incision, you may shower in 24 hours.  The glue will flake  off over the next 2-3 weeks.  Any sutures or staples will be removed at the office during your follow-up visit. 8. ACTIVITIES:  You may resume regular (light) daily activities beginning the next day--such as daily self-care, walking, climbing stairs--gradually increasing activities as tolerated.  You may have sexual intercourse when it is comfortable.  Refrain from any heavy lifting or straining until approved by your doctor. a. You may drive when you are no longer taking prescription pain medication, you can comfortably wear a seatbelt, and you can safely maneuver your car and apply brakes. b. RETURN TO WORK:  __________________________________________________________ 9. You should see your doctor in the office for a follow-up appointment approximately 2-3 weeks after your surgery.  Make sure that you call for this appointment within a day or two after you arrive home to insure a convenient appointment time. 10. OTHER INSTRUCTIONS: __________________________________________________________________________________________________________________________ __________________________________________________________________________________________________________________________ WHEN TO CALL YOUR DOCTOR: 1. Fever over 101.0 2. Inability to urinate 3. Continued bleeding from incision. 4. Increased pain, redness, or drainage from the incision. 5. Increasing abdominal pain  The clinic staff is available to answer your questions during regular business hours.  Please don't hesitate to call and ask to speak to one of the nurses for clinical concerns.  If you have a medical emergency, go to the nearest emergency room or call 911.  A surgeon from [facility] is always on call at the hospital. [address], [address], [place], [place]  [address] ? [address] [address], [place], [place]   [address] [phone] ? [phone] ? [phone]   [phone] Web site: [email or web address]  

## 2011-06-23 NOTE — Preoperative (Signed)
Beta Blockers   Reason not to administer Beta Blockers:Pt took carvedilol this am at 6:00 this am

## 2011-06-23 NOTE — Transfer of Care (Signed)
Immediate Anesthesia Transfer of Care Note  Patient: [name]  Procedure(s) Performed: Procedure(s) (LRB): LAPAROSCOPIC VENTRAL HERNIA (N/A) INSERTION OF MESH (N/A)  Patient Location: PACU  Anesthesia Type: General  Level of Consciousness: awake, alert , oriented, sedated, patient cooperative and responds to stimulation  Airway & Oxygen Therapy: Patient Spontanous Breathing and Patient connected to face mask oxygen  Post-op Assessment: Report given to PACU RN, Post -op Vital signs reviewed and stable and Patient moving all extremities X 4  Post vital signs: stable  Complications: No apparent anesthesia complications

## 2011-06-24 MED ORDER — DIPHENHYDRAMINE HCL 12.5 MG/5ML PO ELIX: 12.5000 mg | ORAL_SOLUTION | Freq: Four times a day (QID) | ORAL | Status: DC | PRN

## 2011-06-24 MED ORDER — SODIUM CHLORIDE 0.9 % IJ SOLN: 9.0000 mL | INTRAMUSCULAR | Status: DC | PRN

## 2011-06-24 MED ORDER — ASPIRIN 81 MG PO CHEW
81.0000 mg | CHEWABLE_TABLET | Freq: Two times a day (BID) | ORAL | Status: DC
  Administered 2011-06-24 – 2011-06-26 (×6): 81 mg via ORAL
  Filled 2011-06-24 (×9): qty 1

## 2011-06-24 MED ORDER — ONDANSETRON HCL 4 MG/2ML IJ SOLN
4.0000 mg | Freq: Four times a day (QID) | INTRAMUSCULAR | Status: DC | PRN
Start: 2011-06-24 — End: 2011-06-26

## 2011-06-24 MED ORDER — KETOROLAC TROMETHAMINE 15 MG/ML IJ SOLN
15.0000 mg | Freq: Once | INTRAMUSCULAR | Status: AC
  Administered 2011-06-24: 15 mg via INTRAVENOUS
  Filled 2011-06-24: qty 1

## 2011-06-24 MED ORDER — LISINOPRIL 40 MG PO TABS
40.0000 mg | ORAL_TABLET | Freq: Every day | ORAL | Status: DC
  Administered 2011-06-24 – 2011-06-27 (×4): 40 mg via ORAL
  Filled 2011-06-24 (×4): qty 1

## 2011-06-24 MED ORDER — MORPHINE SULFATE (PF) 1 MG/ML IV SOLN
INTRAVENOUS | Status: DC
  Administered 2011-06-24: 3 mg via INTRAVENOUS
  Administered 2011-06-24: 1.5 mg via INTRAVENOUS
  Administered 2011-06-24: 09:00:00 via INTRAVENOUS
  Administered 2011-06-25: 3 mg via INTRAVENOUS
  Administered 2011-06-25: 1.5 mg via INTRAVENOUS
  Administered 2011-06-25 (×2): 4.5 mg via INTRAVENOUS
  Administered 2011-06-25: 1.5 mg via INTRAVENOUS
  Administered 2011-06-26: 02:00:00 via INTRAVENOUS
  Administered 2011-06-26 (×2): 1.5 mg via INTRAVENOUS
  Filled 2011-06-24 (×2): qty 25

## 2011-06-24 MED ORDER — ASPIRIN 81 MG PO TABS
81.0000 mg | ORAL_TABLET | Freq: Two times a day (BID) | ORAL | Status: DC
  Filled 2011-06-24 (×2): qty 1

## 2011-06-24 MED ORDER — DIPHENHYDRAMINE HCL 50 MG/ML IJ SOLN: 12.5000 mg | Freq: Four times a day (QID) | INTRAMUSCULAR | Status: DC | PRN

## 2011-06-24 MED ORDER — NALOXONE HCL 0.4 MG/ML IJ SOLN: 0.4000 mg | INTRAMUSCULAR | Status: DC | PRN

## 2011-06-24 NOTE — Progress Notes (Signed)
1 Day Post-Op  Subjective: No nausea/vomiting, no flatus, significant pain at costal margin when moving as expected  Objective: Vital signs in last 24 hours: Temp:  [97.3 F (36.3 C)-99.5 F (37.5 C)] 98.5 F (36.9 C) (04/12 0602) Pulse Rate:  [66-87] 87  (04/12 0602) Resp:  [7-18] 18  (04/12 0602) BP: (114-164)/(62-79) 156/78 mmHg (04/12 0602) SpO2:  [95 %-100 %] 96 % (04/12 0602) Weight:  [186 lb (84.369 kg)] 186 lb (84.369 kg) (04/11 1737) Last BM Date: 06/22/11  Intake/Output from previous day: 04/11 0701 - 04/12 0700 In: 4295 [P.O.:1320; I.V.:2475; IV Piggyback:500] Out: 2575 [Urine:2525; Blood:50] Intake/Output this shift:    General appearance: no distress Resp: clear to auscultation bilaterally Cardio: regular rate and rhythm GI: few bs, appropr tender   Assessment/Plan: POD #1 lap repair of subxyphoid hernia with mesh 1. Neuro- will start pca today due to inadequate pain control 2. CV- restart asa, restart lisinopril 3. Pulm toilet 4. GI- stay on clears today 5. Will hold on removing foley today until pain under better control   LOS: 1 day    [facility] 06/24/2011

## 2011-06-25 DIAGNOSIS — K439 Ventral hernia without obstruction or gangrene: Secondary | ICD-10-CM

## 2011-06-25 LAB — CBC
Hemoglobin: 11.3 g/dL — ABNORMAL LOW (ref 13.0–17.0)
MCH: 30.1 pg (ref 26.0–34.0)
RBC: 3.76 MIL/uL — ABNORMAL LOW (ref 4.22–5.81)

## 2011-06-25 LAB — BASIC METABOLIC PANEL
CO2: 25 mEq/L (ref 19–32)
Calcium: 8 mg/dL — ABNORMAL LOW (ref 8.4–10.5)
GFR calc non Af Amer: 58 mL/min — ABNORMAL LOW (ref >90)
Glucose, Bld: 89 mg/dL (ref 70–99)
Potassium: 4.2 mEq/L (ref 3.5–5.1)
Sodium: 138 mEq/L (ref 135–145)

## 2011-06-25 MED ORDER — PANTOPRAZOLE SODIUM 40 MG PO TBEC
40.0000 mg | DELAYED_RELEASE_TABLET | Freq: Every day | ORAL | Status: DC
  Administered 2011-06-25 – 2011-06-26 (×2): 40 mg via ORAL
  Filled 2011-06-25 (×3): qty 1

## 2011-06-25 NOTE — Progress Notes (Signed)
2 Days Post-Op  Subjective: Tolerating liquid diet.  No flatus or BM.  Objective: Vital signs in last 24 hours: Temp:  [97.5 F (36.4 C)-99.9 F (37.7 C)] 99.2 F (37.3 C) (04/13 0544) Pulse Rate:  [69-90] 78  (04/13 0544) Resp:  [15-20] 15  (04/13 0809) BP: (122-132)/(62-73) 124/73 mmHg (04/13 0808) SpO2:  [93 %-96 %] 95 % (04/13 0809) Last BM Date: 06/22/11  Intake/Output from previous day: 04/12 0701 - 04/13 0700 In: 900 [I.V.:900] Out: 2850 [Urine:2850] Intake/Output this shift:    PE: Abd-soft, incisions clean and intact, active BS  Lab Results:   [facility] 06/25/11 0519  WBC 7.6  HGB 11.3*  HCT 33.9*  PLT 96*   BMET  Basename 06/25/11 0519  NA 138  K 4.2  CL 106  CO2 25  GLUCOSE 89  BUN 12  CREATININE 1.24  CALCIUM 8.0*   PT/INR No results found for this basename: LABPROT:2,INR:2 in the last 72 hours Comprehensive Metabolic Panel:    Component Value Date/Time   NA 138 06/25/2011 0519   NA 145* 01/28/2011 0812   K 4.2 06/25/2011 0519   CL 106 06/25/2011 0519   CO2 25 06/25/2011 0519   BUN 12 06/25/2011 0519   BUN 17 01/28/2011 0812   CREATININE 1.24 06/25/2011 0519   GLUCOSE 89 06/25/2011 0519   GLUCOSE 103* 01/28/2011 0812   CALCIUM 8.0* 06/25/2011 0519   AST 16 03/01/2011 0807   ALT 18 03/01/2011 0807   ALKPHOS 69 03/01/2011 0807   BILITOT 0.7 03/01/2011 0807   PROT 6.0 03/01/2011 0807   PROT 6.3 08/10/2010 0916   ALBUMIN 3.9 08/10/2010 0916     Studies/Results: No results found.  Anti-infectives: Anti-infectives     Start     Dose/Rate Route Frequency Ordered Stop   06/23/11 0643   ceFAZolin (ANCEF) IVPB 2 g/50 mL premix        2 g 100 mL/hr over 30 Minutes Intravenous 60 min pre-op 06/23/11 1610 06/23/11 0841          Assessment Active Problems:  Ventral hernia s/p repair 06/23/11-pain control better today.  Thrombocytopenia    LOS: 2 days   Plan: Advance diet.  Start Lovenox.  Repeat CBC in am.   Bindi Klomp  J 06/25/2011

## 2011-06-26 LAB — CBC
Hemoglobin: 11.4 g/dL — ABNORMAL LOW (ref 13.0–17.0)
MCH: 30.5 pg (ref 26.0–34.0)
Platelets: 99 10*3/uL — ABNORMAL LOW (ref 150–400)
RBC: 3.74 MIL/uL — ABNORMAL LOW (ref 4.22–5.81)
WBC: 6.6 10*3/uL (ref 4.0–10.5)

## 2011-06-26 MED ORDER — MORPHINE SULFATE 2 MG/ML IJ SOLN: 2.0000 mg | INTRAMUSCULAR | Status: DC | PRN

## 2011-06-26 MED ORDER — OXYCODONE-ACETAMINOPHEN 5-325 MG PO TABS: 1.0000 | ORAL_TABLET | ORAL | Status: DC | PRN

## 2011-06-26 NOTE — Progress Notes (Signed)
3 Days Post-Op  Subjective: Had some sharp pain around LLQ incision last night.  Better this am.  Passing a little gas.  Voiding well.  Walked a fair amount yesterday.  Objective: Vital signs in last 24 hours: Temp:  [97.8 F (36.6 C)-98.8 F (37.1 C)] 97.8 F (36.6 C) (04/14 0606) Pulse Rate:  [64-66] 64  (04/14 0606) Resp:  [16-24] 16  (04/14 0746) BP: (120-135)/(54-68) 135/68 mmHg (04/14 0606) SpO2:  [94 %-100 %] 98 % (04/14 0746) FiO2 (%):  [38 %] 38 % (04/13 1940) Last BM Date: 06/22/11  Intake/Output from previous day: 04/13 0701 - 04/14 0700 In: 2206 [P.O.:420; I.V.:1786] Out: 2150 [Urine:2150] Intake/Output this shift:    PE: Abd-soft, incisions clean and intact  Lab Results:   Basename 06/26/11 0441 06/25/11 0519  WBC 6.6 7.6  HGB 11.4* 11.3*  HCT 33.9* 33.9*  PLT 99* 96*   BMET  Basename 06/25/11 0519  NA 138  K 4.2  CL 106  CO2 25  GLUCOSE 89  BUN 12  CREATININE 1.24  CALCIUM 8.0*   PT/INR No results found for this basename: LABPROT:2,INR:2 in the last 72 hours Comprehensive Metabolic Panel:    Component Value Date/Time   NA 138 06/25/2011 0519   NA 145* 01/28/2011 0812   K 4.2 06/25/2011 0519   CL 106 06/25/2011 0519   CO2 25 06/25/2011 0519   BUN 12 06/25/2011 0519   BUN 17 01/28/2011 0812   CREATININE 1.24 06/25/2011 0519   GLUCOSE 89 06/25/2011 0519   GLUCOSE 103* 01/28/2011 0812   CALCIUM 8.0* 06/25/2011 0519   AST 16 03/01/2011 0807   ALT 18 03/01/2011 0807   ALKPHOS 69 03/01/2011 0807   BILITOT 0.7 03/01/2011 0807   PROT 6.0 03/01/2011 0807   PROT 6.3 08/10/2010 0916   ALBUMIN 3.9 08/10/2010 0916     Studies/Results: No results found.  Anti-infectives: Anti-infectives     Start     Dose/Rate Route Frequency Ordered Stop   06/23/11 0643   ceFAZolin (ANCEF) IVPB 2 g/50 mL premix        2 g 100 mL/hr over 30 Minutes Intravenous 60 min pre-op 06/23/11 1610 06/23/11 0841          Assessment Principal Problem:  *Ventral  hernia s/p repair 06/23/11-progressing slowly  Thrombocytopenia-stable   LOS: 3 days   Plan:  D/C PCA and start oral analgesic.  Decrease IVF.   Henry Demeritt J 06/26/2011

## 2011-06-27 MED ORDER — OXYCODONE-ACETAMINOPHEN 5-325 MG PO TABS: 1.0000 | ORAL_TABLET | ORAL | Status: AC | PRN

## 2011-06-27 MED ORDER — DSS 100 MG PO CAPS: 100.0000 mg | ORAL_CAPSULE | Freq: Two times a day (BID) | ORAL | Status: AC

## 2011-06-27 NOTE — Progress Notes (Signed)
4 Days Post-Op  Subjective: Pain controlled, tol diet, ambulating passing flatus, ready to go home  Objective: Vital signs in last 24 hours: Temp:  [98.4 F (36.9 C)-98.8 F (37.1 C)] 98.4 F (36.9 C) (04/15 0520) Pulse Rate:  [61-66] 61  (04/15 0520) Resp:  [18] 18  (04/15 0520) BP: (126-133)/(68-72) 130/70 mmHg (04/15 0759) SpO2:  [94 %-96 %] 94 % (04/15 0520) Last BM Date: 06/22/11  Intake/Output from previous day: 04/14 0701 - 04/15 0700 In: 1303.8 [P.O.:480; I.V.:823.8] Out: 400 [Urine:400] Intake/Output this shift:    General appearance: no distress Resp: clear to auscultation bilaterally Cardio: regular rate and rhythm GI: small seroma at hernia site, bs present, wounds clean   Assessment/Plan: POD # 4 lvh Dc home today   LOS: 4 days    [facility] 06/27/2011

## 2011-06-29 ENCOUNTER — Telehealth (INDEPENDENT_AMBULATORY_CARE_PROVIDER_SITE_OTHER): Payer: Self-pay

## 2011-06-29 NOTE — Telephone Encounter (Signed)
LMOM giving them the f/u appt with Dr Dwain Sarna.

## 2011-06-30 ENCOUNTER — Telehealth (INDEPENDENT_AMBULATORY_CARE_PROVIDER_SITE_OTHER): Payer: Self-pay

## 2011-06-30 NOTE — Telephone Encounter (Signed)
I notified the pt that Dr Dwain Sarna agrees

## 2011-06-30 NOTE — Telephone Encounter (Signed)
The patient called to report that since his incisional hernia repair on 06/23/11 he is unable to lay flat at night.  He only takes a pain pill in the evening when he is ready for bed but he does not help the sharp pain he gets when he lies flat.  It is a sharp pain on the left side incision.  It doesn't hurt when he reclines in the recliner.  He is taking Oxycodone.  He has no fever.  He is eating and drinking ok.  I advised that it is probably muscle irritation and when he is flat it stretches and pulls it.  I advised it is probably best that he recline.  I will notify Dr Dwain Sarna to confirm this.

## 2011-07-06 ENCOUNTER — Encounter (HOSPITAL_COMMUNITY): Payer: Self-pay | Admitting: General Surgery

## 2011-07-08 ENCOUNTER — Ambulatory Visit (INDEPENDENT_AMBULATORY_CARE_PROVIDER_SITE_OTHER): Payer: Medicare Other | Admitting: General Surgery

## 2011-07-08 ENCOUNTER — Encounter (INDEPENDENT_AMBULATORY_CARE_PROVIDER_SITE_OTHER): Payer: Self-pay | Admitting: General Surgery

## 2011-07-08 VITALS — BP 132/74 | HR 70 | Temp 97.8°F | Resp 18 | Ht 70.0 in | Wt 183.0 lb

## 2011-07-08 DIAGNOSIS — Z09 Encounter for follow-up examination after completed treatment for conditions other than malignant neoplasm: Secondary | ICD-10-CM

## 2011-07-08 NOTE — Progress Notes (Signed)
Subjective:     Patient ID: [name], male   DOB: 02-Apr-1940, 71 y.o.   MRN: [id number]  HPI This is a 71 year old male who had a subxiphoid hernia after a sternotomy. I did a laparoscopic ventral hernia repair with mesh. He has done very well. He is eating just fine. He has a small seroma at the site but is otherwise really without any significant complaints except for some left costal margin pain at the site of my sutures. This is slowly getting better. It is difficult for him to lie down flat but again this is getting better. He is begun is exercised again without any difficulty.  Review of Systems     Objective:   Physical Exam Well healed incisions without infection, small seroma at site of hernia    Assessment:     S/p lvh of subxyphoid hernia    Plan:        He is doing as expected. I think this pain will just continue to get better over some time. I told him I see him back in a month and  I can inject some local anesthetic at these sites if needed. He is going to just continue walking on the treadmill for now.

## 2011-07-24 ENCOUNTER — Other Ambulatory Visit: Payer: Self-pay | Admitting: Family Medicine

## 2011-07-24 DIAGNOSIS — I10 Essential (primary) hypertension: Secondary | ICD-10-CM

## 2011-07-24 DIAGNOSIS — N400 Enlarged prostate without lower urinary tract symptoms: Secondary | ICD-10-CM

## 2011-07-26 ENCOUNTER — Other Ambulatory Visit: Payer: Medicare Other

## 2011-07-28 ENCOUNTER — Other Ambulatory Visit (INDEPENDENT_AMBULATORY_CARE_PROVIDER_SITE_OTHER): Payer: Medicare Other

## 2011-07-28 DIAGNOSIS — I10 Essential (primary) hypertension: Secondary | ICD-10-CM

## 2011-07-28 DIAGNOSIS — N400 Enlarged prostate without lower urinary tract symptoms: Secondary | ICD-10-CM

## 2011-07-28 LAB — COMPREHENSIVE METABOLIC PANEL
AST: 14 U/L (ref 0–37)
Albumin: 3.8 g/dL (ref 3.5–5.2)
Alkaline Phosphatase: 56 U/L (ref 39–117)
BUN: 16 mg/dL (ref 6–23)
Creatinine, Ser: 1.3 mg/dL (ref 0.4–1.5)
Glucose, Bld: 95 mg/dL (ref 70–99)
Total Bilirubin: 0.8 mg/dL (ref 0.3–1.2)

## 2011-07-28 LAB — PSA, MEDICARE: PSA: 0.48 ng/mL (ref <=4.00)

## 2011-07-28 LAB — LIPID PANEL
Cholesterol: 120 mg/dL (ref 0–200)
HDL: 42.7 mg/dL (ref >39.00)
LDL Cholesterol: 66 mg/dL (ref 0–99)
Triglycerides: 58 mg/dL (ref 0.0–149.0)
VLDL: 11.6 mg/dL (ref 0.0–40.0)

## 2011-08-01 NOTE — Discharge Summary (Signed)
Physician Discharge Summary  Patient ID: [name] MRN: [id number] DOB/AGE: 1940/05/18 71 y.o.  Admit date: 06/23/2011 Discharge date: 06/28/2011  Admission Diagnoses: Incisional hernia  Discharge Diagnoses:  Principal Problem:  *Ventral hernia s/p repair 06/23/11   Discharged Condition:Good Hospital Course: 77 yom with symptomatic incisional hernia in subxyphoid position after sternotomy.  We discussed options including observation, laparoscopic vs open repair of this hernia. We agreed to proceed with laparoscopic repair which he underwent and tolerated well.  Postoperatively his main issue was an ileus that resolved over several days and pain control due to fixation. Upon discharge he was tolerating diet, ambulating and pain was controlled.  Consults:none  Significant Diagnostic Studies:none  Treatments: laparoscopic ventral hernia repair with mesh    Disposition: 01-Home or Self Care  Discharge Orders    Future Appointments: Provider: Department: Dept Phone: Center:   08/02/2011 9:45 AM Joaquim Nam, MD [facility] [phone] [id number]   08/05/2011 10:20 AM Emelia Loron, MD [facility] [phone] None     Medication List  As of 08/01/2011 10:32 PM   TAKE these medications         acyclovir ointment 5 %   Commonly known as: ZOVIRAX   Apply 1 application topically as needed. Rash        ALPRAZolam 0.25 MG tablet   Commonly known as: XANAX   Take 0.25 mg by mouth at bedtime as needed. Sleep        amLODipine 5 MG tablet   Commonly known as: NORVASC   Take 5 mg by mouth every evening.      aspirin 81 MG tablet   Take 81 mg by mouth 2 (two) times daily.      carvedilol 6.25 MG tablet   Commonly known as: COREG   TAKE 1 TABLET BY MOUTH TWICE DAILY      finasteride 5 MG tablet   Commonly known as: PROSCAR   Take 1 tablet (5 mg total) by mouth daily.      FISH OIL MAXIMUM STRENGTH 1200 MG Caps   Take 1 capsule by mouth daily.     lisinopril 40 MG tablet   Commonly known as: PRINIVIL,ZESTRIL   Take 40 mg by mouth daily after breakfast.      mulitivitamin with minerals Tabs   Take 1 tablet by mouth daily.      rosuvastatin 20 MG tablet   Commonly known as: CRESTOR   Take 20 mg by mouth at bedtime.           Follow-up Information    Follow up with [facility], MD. Schedule an appointment as soon as possible for a visit in 2 weeks.   Contact information:   [employer] [address] [place] [address] [phone]          Signed: Emelia Loron 08/01/2011, 10:32 PM

## 2011-08-02 ENCOUNTER — Ambulatory Visit (INDEPENDENT_AMBULATORY_CARE_PROVIDER_SITE_OTHER): Payer: Medicare Other | Admitting: Family Medicine

## 2011-08-02 ENCOUNTER — Encounter: Payer: Self-pay | Admitting: Family Medicine

## 2011-08-02 VITALS — BP 126/68 | HR 74 | Temp 98.6°F | Ht 70.0 in | Wt 183.0 lb

## 2011-08-02 DIAGNOSIS — Z87898 Personal history of other specified conditions: Secondary | ICD-10-CM

## 2011-08-02 DIAGNOSIS — L570 Actinic keratosis: Secondary | ICD-10-CM

## 2011-08-02 DIAGNOSIS — E7800 Pure hypercholesterolemia, unspecified: Secondary | ICD-10-CM

## 2011-08-02 DIAGNOSIS — Z23 Encounter for immunization: Secondary | ICD-10-CM

## 2011-08-02 DIAGNOSIS — Z0000 Encounter for general adult medical examination without abnormal findings: Secondary | ICD-10-CM

## 2011-08-02 DIAGNOSIS — I10 Essential (primary) hypertension: Secondary | ICD-10-CM

## 2011-08-02 DIAGNOSIS — G4700 Insomnia, unspecified: Secondary | ICD-10-CM

## 2011-08-02 DIAGNOSIS — I2510 Atherosclerotic heart disease of native coronary artery without angina pectoris: Secondary | ICD-10-CM

## 2011-08-02 MED ORDER — ZOSTER VACCINE LIVE 19400 UNT/0.65ML ~~LOC~~ SOLR: 0.6500 mL | Freq: Once | SUBCUTANEOUS | Status: AC

## 2011-08-02 MED ORDER — CARVEDILOL 6.25 MG PO TABS: 6.2500 mg | ORAL_TABLET | Freq: Two times a day (BID) | ORAL | Status: DC

## 2011-08-02 MED ORDER — ACYCLOVIR 5 % EX OINT: 1.0000 "application " | TOPICAL_OINTMENT | CUTANEOUS | Status: AC | PRN

## 2011-08-02 MED ORDER — ALPRAZOLAM 0.25 MG PO TABS: 0.2500 mg | ORAL_TABLET | Freq: Every evening | ORAL | Status: DC | PRN

## 2011-08-02 MED ORDER — AMLODIPINE BESYLATE 5 MG PO TABS: 5.0000 mg | ORAL_TABLET | Freq: Every evening | ORAL | Status: DC

## 2011-08-02 MED ORDER — LISINOPRIL 40 MG PO TABS: 40.0000 mg | ORAL_TABLET | Freq: Every day | ORAL | Status: DC

## 2011-08-02 MED ORDER — FINASTERIDE 5 MG PO TABS: 5.0000 mg | ORAL_TABLET | Freq: Every day | ORAL | Status: DC

## 2011-08-02 NOTE — Patient Instructions (Addendum)
I would get a flu shot each fall.   Ask [facility] about the shingles shot.  See Shirlee Limerick about your referral before you leave today. Take care.  Glad to see you .

## 2011-08-02 NOTE — Progress Notes (Signed)
I have personally reviewed the Medicare Annual Wellness questionnaire and have noted 1. The patient's medical and social history 2. Their use of alcohol, tobacco or illicit drugs 3. Their current medications and supplements 4. The patient's functional ability including ADL's, fall risks, home safety risks and hearing or visual             impairment. 5. Diet and physical activities 6. Evidence for depression or mood disorders  The patients weight, height, BMI have been recorded in the chart and visual acuity is per eye clinic.   I have made referrals, counseling and provided education to the patient based review of the above and I have provided the pt with a written personalized care plan for preventive services.  We talked about AD today.  He doesn't have a formal one yet.  They have discussed.   See scanned forms.  Routine anticipatory guidance given to patient.  See health maintenance. Flu 2012 Shingles shot to be done at phamacy PNA 2011 Td 2003 Colon 2012  Elevated Cholesterol: Using medications without problems:yes Muscle aches: no Diet compliance:yes Exercise:yes Other complaints:no  Hypertension:    Using medication without problems or lightheadedness: yes Chest pain with exertion:no Edema:no Short of breath:no  CAD.  No CP, SOB, BLE edema.  Exercise tolerance is good.  Insomnia.  Rare BZD use.  Good effect, no ADE.    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  Uses acyclovir prn with good effect.   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  Would like derm eval.  Will refer.   PMH and SH reviewed  Meds, vitals, and allergies reviewed.   ROS: See HPI.  Otherwise negative.    GEN: nad, alert and oriented HEENT: mucous membranes moist NECK: supple w/o LA CV: rrr. Murmur noted.  PULM: ctab, no inc wob ABD: soft, +bs EXT: no edema SKIN: no acute rash but AKs noted.  Prostate gland firm and smooth, no enlargement, nodularity, tenderness, mass, asymmetry or induration.

## 2011-08-04 DIAGNOSIS — Z0000 Encounter for general adult medical examination without abnormal findings: Secondary | ICD-10-CM | POA: Insufficient documentation

## 2011-08-04 NOTE — Assessment & Plan Note (Signed)
Refer to derm  ?

## 2011-08-04 NOTE — Assessment & Plan Note (Signed)
W/o CP and feeling well.  Continue current meds.

## 2011-08-04 NOTE — Assessment & Plan Note (Signed)
Controlled, continue current meds.   

## 2011-08-04 NOTE — Assessment & Plan Note (Signed)
PSA wnl.  Continue as is.

## 2011-08-05 ENCOUNTER — Ambulatory Visit (INDEPENDENT_AMBULATORY_CARE_PROVIDER_SITE_OTHER): Payer: Medicare Other | Admitting: General Surgery

## 2011-08-05 ENCOUNTER — Encounter (INDEPENDENT_AMBULATORY_CARE_PROVIDER_SITE_OTHER): Payer: Self-pay | Admitting: General Surgery

## 2011-08-05 VITALS — BP 142/71 | HR 67 | Temp 97.8°F | Ht 70.0 in | Wt 183.0 lb

## 2011-08-05 DIAGNOSIS — Z09 Encounter for follow-up examination after completed treatment for conditions other than malignant neoplasm: Secondary | ICD-10-CM

## 2011-08-05 NOTE — Progress Notes (Signed)
Subjective:     Patient ID: [name], male   DOB: 1940/05/31, 71 y.o.   MRN: [id number]  HPI This is a 71 year old male who had a subxiphoid hernia after a sternotomy. I did a laparoscopic ventral hernia repair with mesh. He has done very well. He has no real complaints.  He still has some pain at his left costal margin but this is getting better.     Review of Systems     Objective:   Physical Exam Healed incisions, small seroma continues to resolve    Assessment:     S/p lvh of subxyphoid hernia    Plan:     Seroma will continue to resolve I think this pain will continue to resolve also.  He will call me if it worsens or if not any better in a couple months Released to full activity

## 2011-08-29 ENCOUNTER — Encounter: Payer: Self-pay | Admitting: Family Medicine

## 2011-10-04 ENCOUNTER — Other Ambulatory Visit: Payer: Self-pay | Admitting: Family Medicine

## 2011-11-21 ENCOUNTER — Ambulatory Visit (INDEPENDENT_AMBULATORY_CARE_PROVIDER_SITE_OTHER): Payer: Medicare Other | Admitting: Cardiology

## 2011-11-21 ENCOUNTER — Encounter: Payer: Self-pay | Admitting: Cardiology

## 2011-11-21 VITALS — BP 132/72 | HR 62 | Ht 70.0 in | Wt 181.0 lb

## 2011-11-21 DIAGNOSIS — I4891 Unspecified atrial fibrillation: Secondary | ICD-10-CM

## 2011-11-21 DIAGNOSIS — I359 Nonrheumatic aortic valve disorder, unspecified: Secondary | ICD-10-CM

## 2011-11-21 DIAGNOSIS — I2510 Atherosclerotic heart disease of native coronary artery without angina pectoris: Secondary | ICD-10-CM

## 2011-11-21 DIAGNOSIS — I10 Essential (primary) hypertension: Secondary | ICD-10-CM

## 2011-11-21 DIAGNOSIS — E7800 Pure hypercholesterolemia, unspecified: Secondary | ICD-10-CM

## 2011-11-21 DIAGNOSIS — Z79899 Other long term (current) drug therapy: Secondary | ICD-10-CM

## 2011-11-21 MED ORDER — AMLODIPINE BESYLATE 5 MG PO TABS: 5.0000 mg | ORAL_TABLET | Freq: Every evening | ORAL | Status: DC

## 2011-11-21 MED ORDER — ROSUVASTATIN CALCIUM 20 MG PO TABS: 20.0000 mg | ORAL_TABLET | Freq: Every day | ORAL | Status: DC
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
## 2011-11-21 NOTE — Assessment & Plan Note (Signed)
Good BP control on current regimen.

## 2011-11-21 NOTE — Assessment & Plan Note (Addendum)
Bioprosthetic aortic valve, well-seated on last echo.  Needs antibiotics with any dental work.

## 2011-11-21 NOTE — Assessment & Plan Note (Signed)
LDL at goal (< 70) when checked in 5/13.

## 2011-11-21 NOTE — Assessment & Plan Note (Signed)
No chest pain.  Patient had SVG-OM and SVG-D at the time of his AVR.  He will continue ASA, statin, and Coreg.   

## 2011-11-21 NOTE — Assessment & Plan Note (Signed)
Only noted post-op.  He is not anticoagulated.

## 2011-11-21 NOTE — Patient Instructions (Addendum)
The current medical regimen is effective;  continue present plan and medications.  Please have blood work in 11/13  (fasting lipid panel, hepatic panel).  Follow up in 6 months with Dr Shirlee Latch.

## 2011-11-25 ENCOUNTER — Other Ambulatory Visit: Payer: Self-pay | Admitting: Family Medicine

## 2011-12-26 ENCOUNTER — Ambulatory Visit (INDEPENDENT_AMBULATORY_CARE_PROVIDER_SITE_OTHER): Payer: Medicare Other

## 2011-12-26 DIAGNOSIS — Z23 Encounter for immunization: Secondary | ICD-10-CM

## 2012-01-08 ENCOUNTER — Other Ambulatory Visit: Payer: Self-pay | Admitting: Cardiology

## 2012-01-26 ENCOUNTER — Other Ambulatory Visit (INDEPENDENT_AMBULATORY_CARE_PROVIDER_SITE_OTHER): Payer: Medicare Other

## 2012-01-26 DIAGNOSIS — E7800 Pure hypercholesterolemia, unspecified: Secondary | ICD-10-CM

## 2012-01-26 DIAGNOSIS — Z79899 Other long term (current) drug therapy: Secondary | ICD-10-CM

## 2012-01-26 LAB — HEPATIC FUNCTION PANEL
ALT: 17 U/L (ref 0–53)
Albumin: 4.1 g/dL (ref 3.5–5.2)
Bilirubin, Direct: 0.2 mg/dL (ref 0.0–0.3)
Total Protein: 6.3 g/dL (ref 6.0–8.3)

## 2012-01-26 LAB — LIPID PANEL
Cholesterol: 132 mg/dL (ref 0–200)
HDL: 39.7 mg/dL (ref >39.00)
LDL Cholesterol: 68 mg/dL (ref 0–99)
Triglycerides: 121 mg/dL (ref 0.0–149.0)
VLDL: 24.2 mg/dL (ref 0.0–40.0)

## 2012-02-21 ENCOUNTER — Other Ambulatory Visit: Payer: Self-pay | Admitting: Family Medicine

## 2012-02-21 NOTE — Telephone Encounter (Signed)
Electronic refill request.  Please advise. 

## 2012-02-22 ENCOUNTER — Other Ambulatory Visit: Payer: Self-pay | Admitting: Family Medicine

## 2012-02-22 NOTE — Telephone Encounter (Signed)
Please call in

## 2012-02-22 NOTE — Telephone Encounter (Signed)
[facility] 

## 2012-04-03 ENCOUNTER — Telehealth: Payer: Self-pay | Admitting: *Deleted

## 2012-04-03 MED ORDER — AMOXICILLIN 500 MG PO CAPS: ORAL_CAPSULE | ORAL | Status: AC

## 2012-04-03 NOTE — Telephone Encounter (Signed)
Pt is going to dentist on Monday and needs script for amoxicillin 500 mg's, # 4, to take prior to visit.  [facility]

## 2012-04-03 NOTE — Telephone Encounter (Signed)
Sent!

## 2012-04-16 ENCOUNTER — Other Ambulatory Visit: Payer: Self-pay | Admitting: Cardiology

## 2012-04-17 NOTE — Telephone Encounter (Signed)
Fax Received. Refill Completed. Pegge Cumberledge Chowoe (R.M.A)   

## 2012-06-20 ENCOUNTER — Ambulatory Visit (INDEPENDENT_AMBULATORY_CARE_PROVIDER_SITE_OTHER): Payer: Medicare Other | Admitting: Cardiology

## 2012-06-20 ENCOUNTER — Encounter: Payer: Self-pay | Admitting: Cardiology

## 2012-06-20 VITALS — BP 118/72 | HR 61 | Ht 70.0 in | Wt 180.0 lb

## 2012-06-20 DIAGNOSIS — I10 Essential (primary) hypertension: Secondary | ICD-10-CM

## 2012-06-20 DIAGNOSIS — I2510 Atherosclerotic heart disease of native coronary artery without angina pectoris: Secondary | ICD-10-CM

## 2012-06-20 DIAGNOSIS — E7800 Pure hypercholesterolemia, unspecified: Secondary | ICD-10-CM

## 2012-06-20 DIAGNOSIS — Z954 Presence of other heart-valve replacement: Secondary | ICD-10-CM

## 2012-06-20 DIAGNOSIS — Z952 Presence of prosthetic heart valve: Secondary | ICD-10-CM

## 2012-06-20 DIAGNOSIS — I4891 Unspecified atrial fibrillation: Secondary | ICD-10-CM
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
## 2012-06-20 NOTE — Patient Instructions (Addendum)
Your physician recommends that you return for a FASTING lipid profile in June 2014.  Your physician wants you to follow-up in: 1 year with Dr Shirlee Latch. (April 2015). You will receive a reminder letter in the mail two months in advance. If you don't receive a letter, please call our office to schedule the follow-up appointment.

## 2012-06-29 ENCOUNTER — Other Ambulatory Visit: Payer: Self-pay | Admitting: Cardiology

## 2012-07-16 ENCOUNTER — Other Ambulatory Visit: Payer: Self-pay | Admitting: Cardiology

## 2012-08-07 ENCOUNTER — Telehealth: Payer: Self-pay

## 2012-08-07 NOTE — Telephone Encounter (Signed)
Pt left v/m wanting to know what injection he received at 08/02/11. Left v/m for pt to call back.

## 2012-08-07 NOTE — Telephone Encounter (Signed)
Advised pt had TD; pt said should insurance pay for that; advised pt unless an injury such as a cut, abrasion or step on nail, medicare does not pay for TD if it is just time to update immunization. Pt voiced understanding.

## 2012-08-27 ENCOUNTER — Other Ambulatory Visit: Payer: Medicare Other

## 2012-08-31 ENCOUNTER — Other Ambulatory Visit: Payer: Self-pay | Admitting: Cardiology

## 2012-09-05 ENCOUNTER — Other Ambulatory Visit: Payer: Self-pay | Admitting: Family Medicine

## 2012-09-05 NOTE — Telephone Encounter (Signed)
Electronic refill request, no recent or future appt, will forward to PCP for review

## 2012-09-05 NOTE — Telephone Encounter (Signed)
Schedule CPE.  Sent.  

## 2012-09-06 NOTE — Telephone Encounter (Signed)
Patient says he will be moving to [facility], and getting a new MD at that location.

## 2012-09-26 ENCOUNTER — Other Ambulatory Visit: Payer: Self-pay | Admitting: *Deleted

## 2012-09-26 ENCOUNTER — Other Ambulatory Visit: Payer: Self-pay

## 2012-09-26 MED ORDER — LISINOPRIL 40 MG PO TABS: 40.0000 mg | ORAL_TABLET | Freq: Every day | ORAL | Status: DC

## 2012-09-26 MED ORDER — LISINOPRIL 40 MG PO TABS: 40.0000 mg | ORAL_TABLET | Freq: Every day | ORAL | Status: AC

## 2013-01-14 IMAGING — CR DG CHEST 1V PORT
1 series · 1 of 1 positions shown · non-contrast
Comparison: 05/06/2010.

CLINICAL DATA: Aortic valve replacement.  CABG.

PORTABLE CHEST - 1 VIEW

[view not recorded]
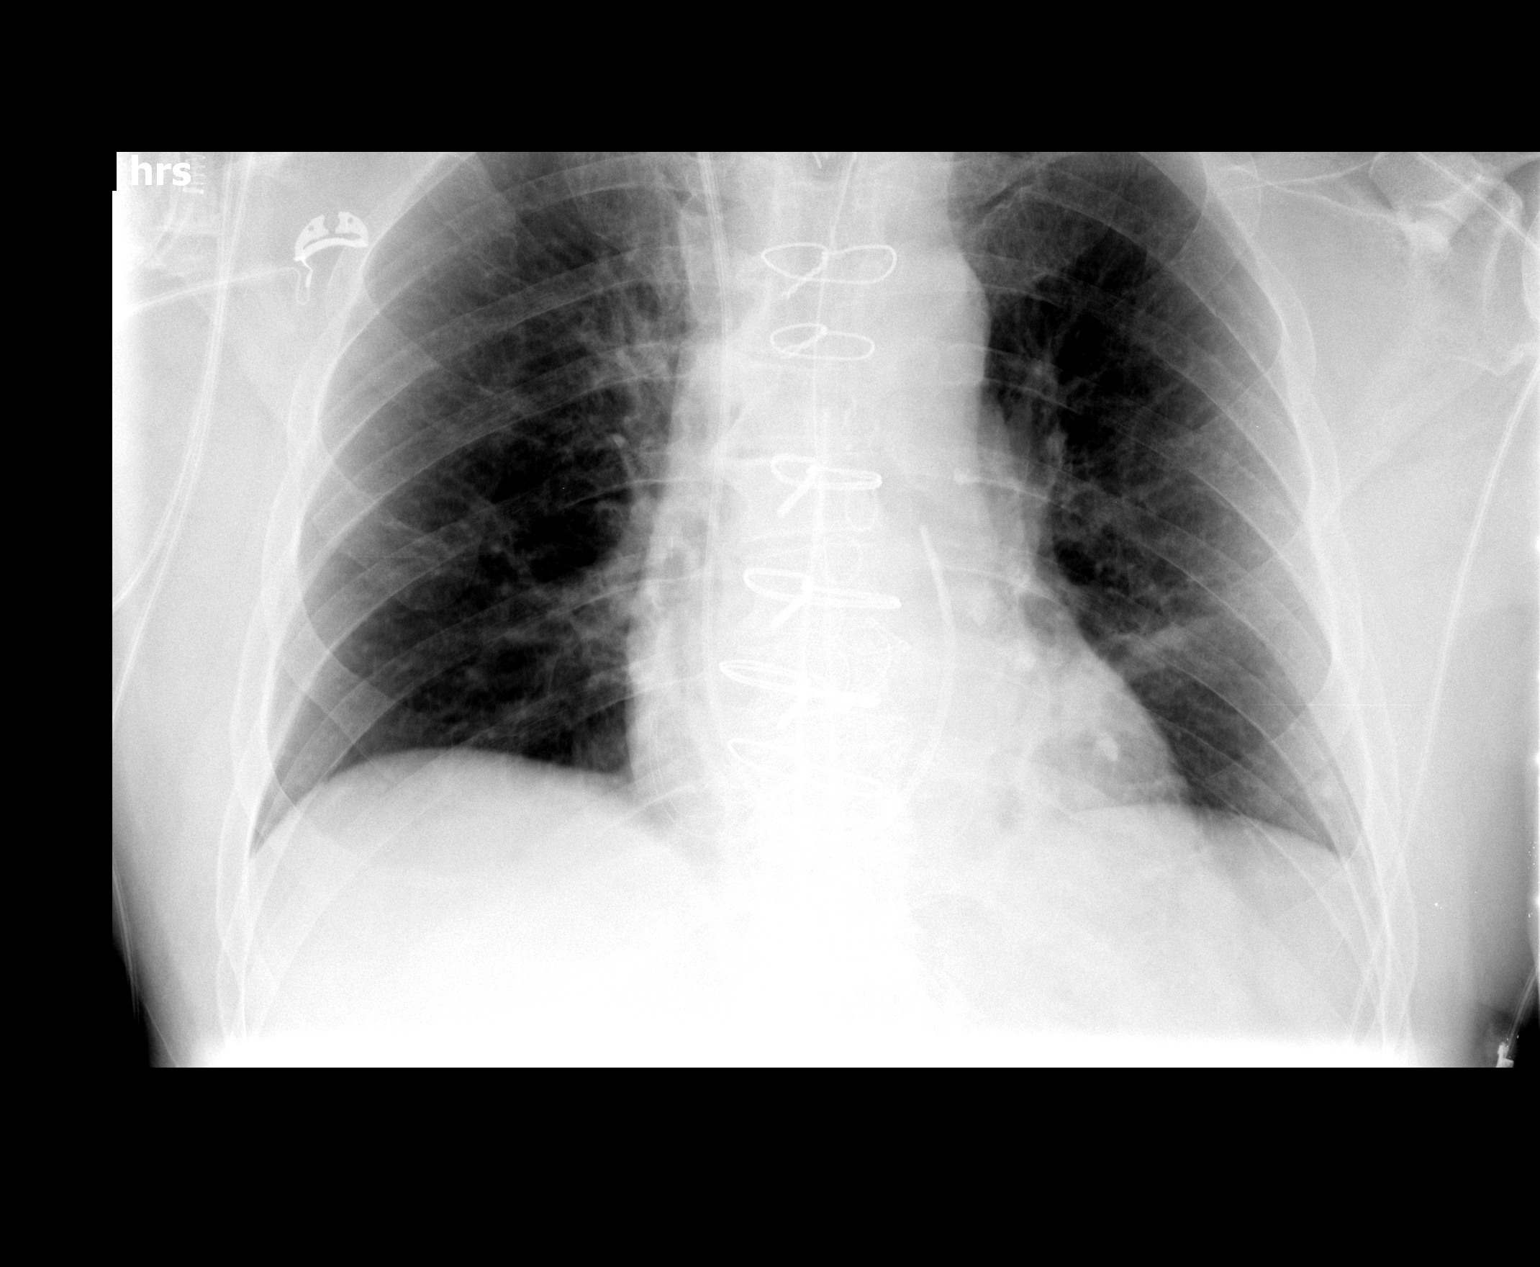

[1 of 1 positions shown; findings below may reference images not displayed]

FINDINGS: The endotracheal tube is 7.7 cm from the carina.  New
median sternotomy wires and CABG markers.  Right IJ vascular sheath
transmits a Swan-Ganz catheter with the tip in the pulmonary
outflow tract.  Mediastinal drains, nasogastric tube noted.  No
pneumothorax.  No pleural effusion.  Lung volumes slightly reduced.
New aortic valve replacement with bioprosthetic valve.  Mild
subsegmental atelectasis along the left heart border.
IMPRESSION: 1.  Endotracheal tube 7.7 cm from the carina.  Swan-Ganz catheter,
nasogastric tube and mediastinal drains appear in good position.

2.  Status post CABG and aortic valve replacement.

## 2013-01-15 IMAGING — CR DG CHEST 1V PORT
1 series · 1 of 1 positions shown · non-contrast
Comparison: 05/10/2010.

CLINICAL DATA: Postop day #1 status post CABG.

PORTABLE CHEST - 1 VIEW

[view not recorded]
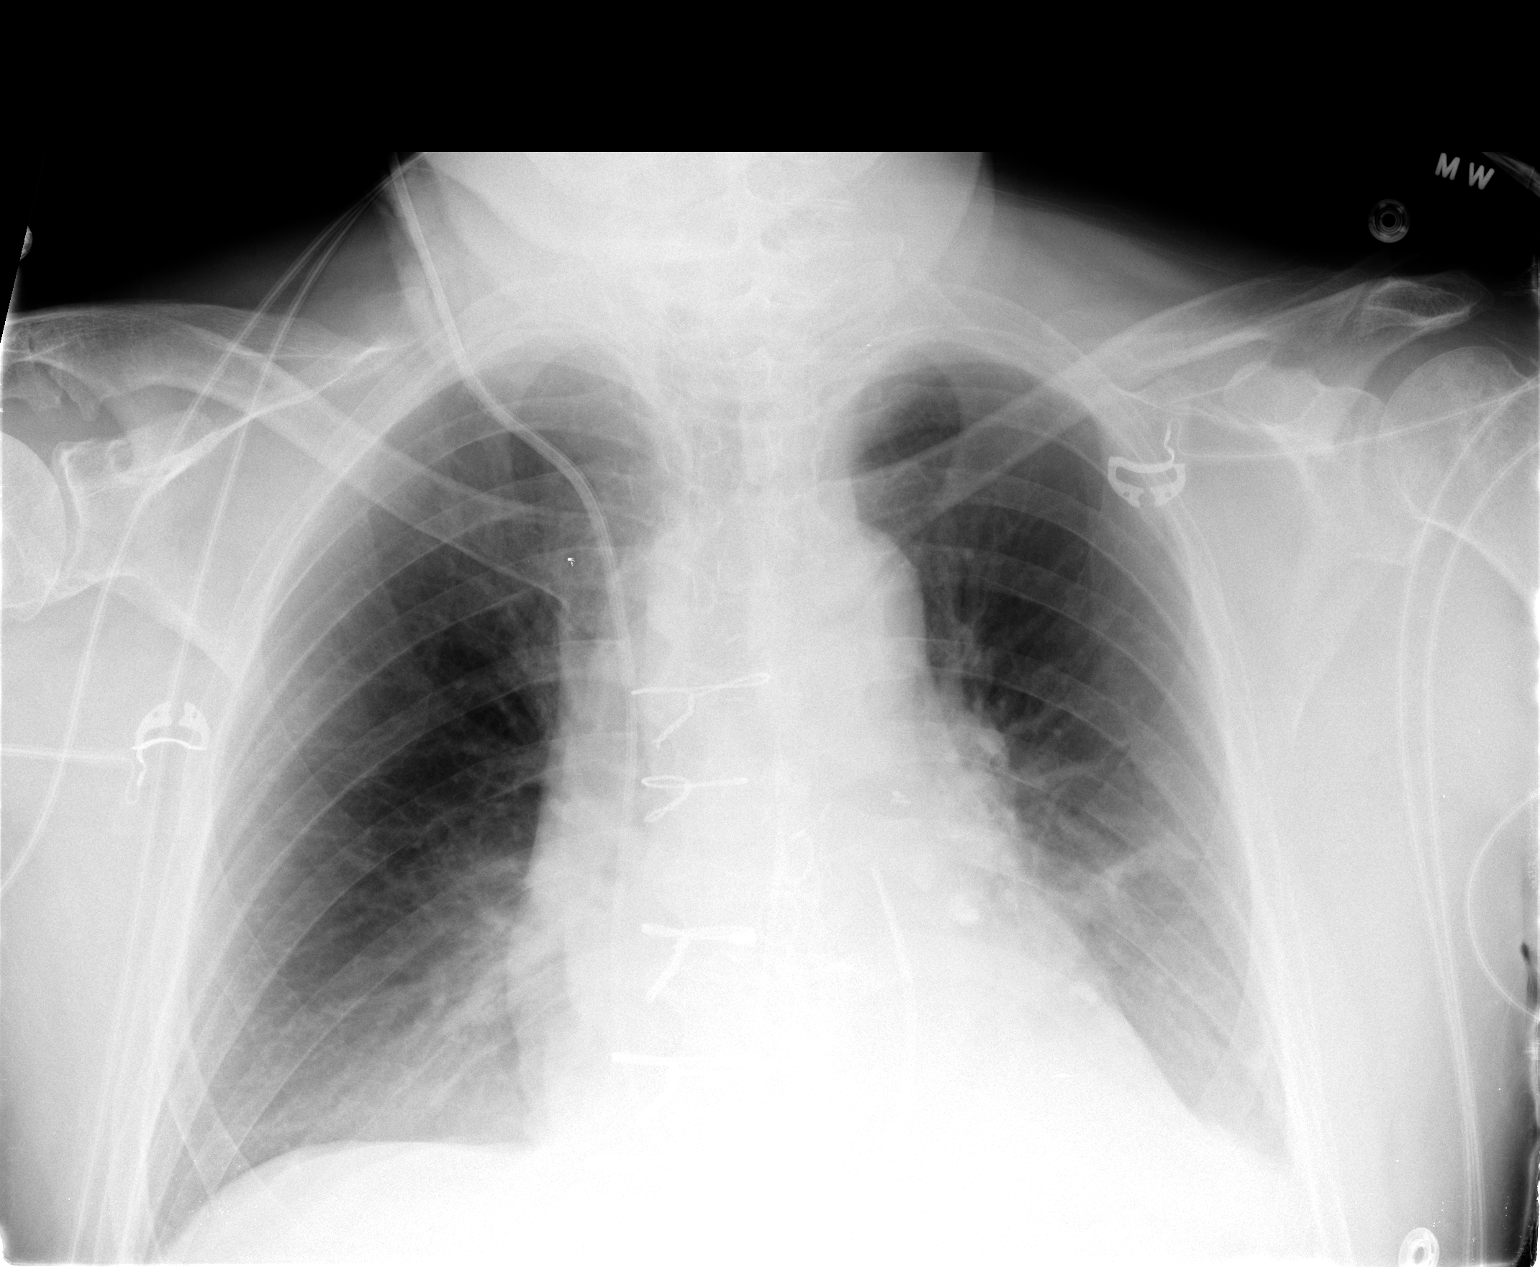

[1 of 1 positions shown; findings below may reference images not displayed]

FINDINGS: Interval extubation.  Postsurgical changes related to
prior CABG.  Stable right IJ Swan-Ganz catheter.  Stable
mediastinal drain.  Interval removal of enteric tube.

Lungs grossly clear.  No pneumothorax.
IMPRESSION: Interval extubation and removal of enteric tube.  Grossly clear
lungs.  No pneumothorax.

## 2013-01-16 IMAGING — CR DG CHEST 2V
2 series · 2 of 2 positions shown · non-contrast
Comparison: 05/11/2010

CLINICAL DATA: CABG.

CHEST - 2 VIEW

[w chest pa]
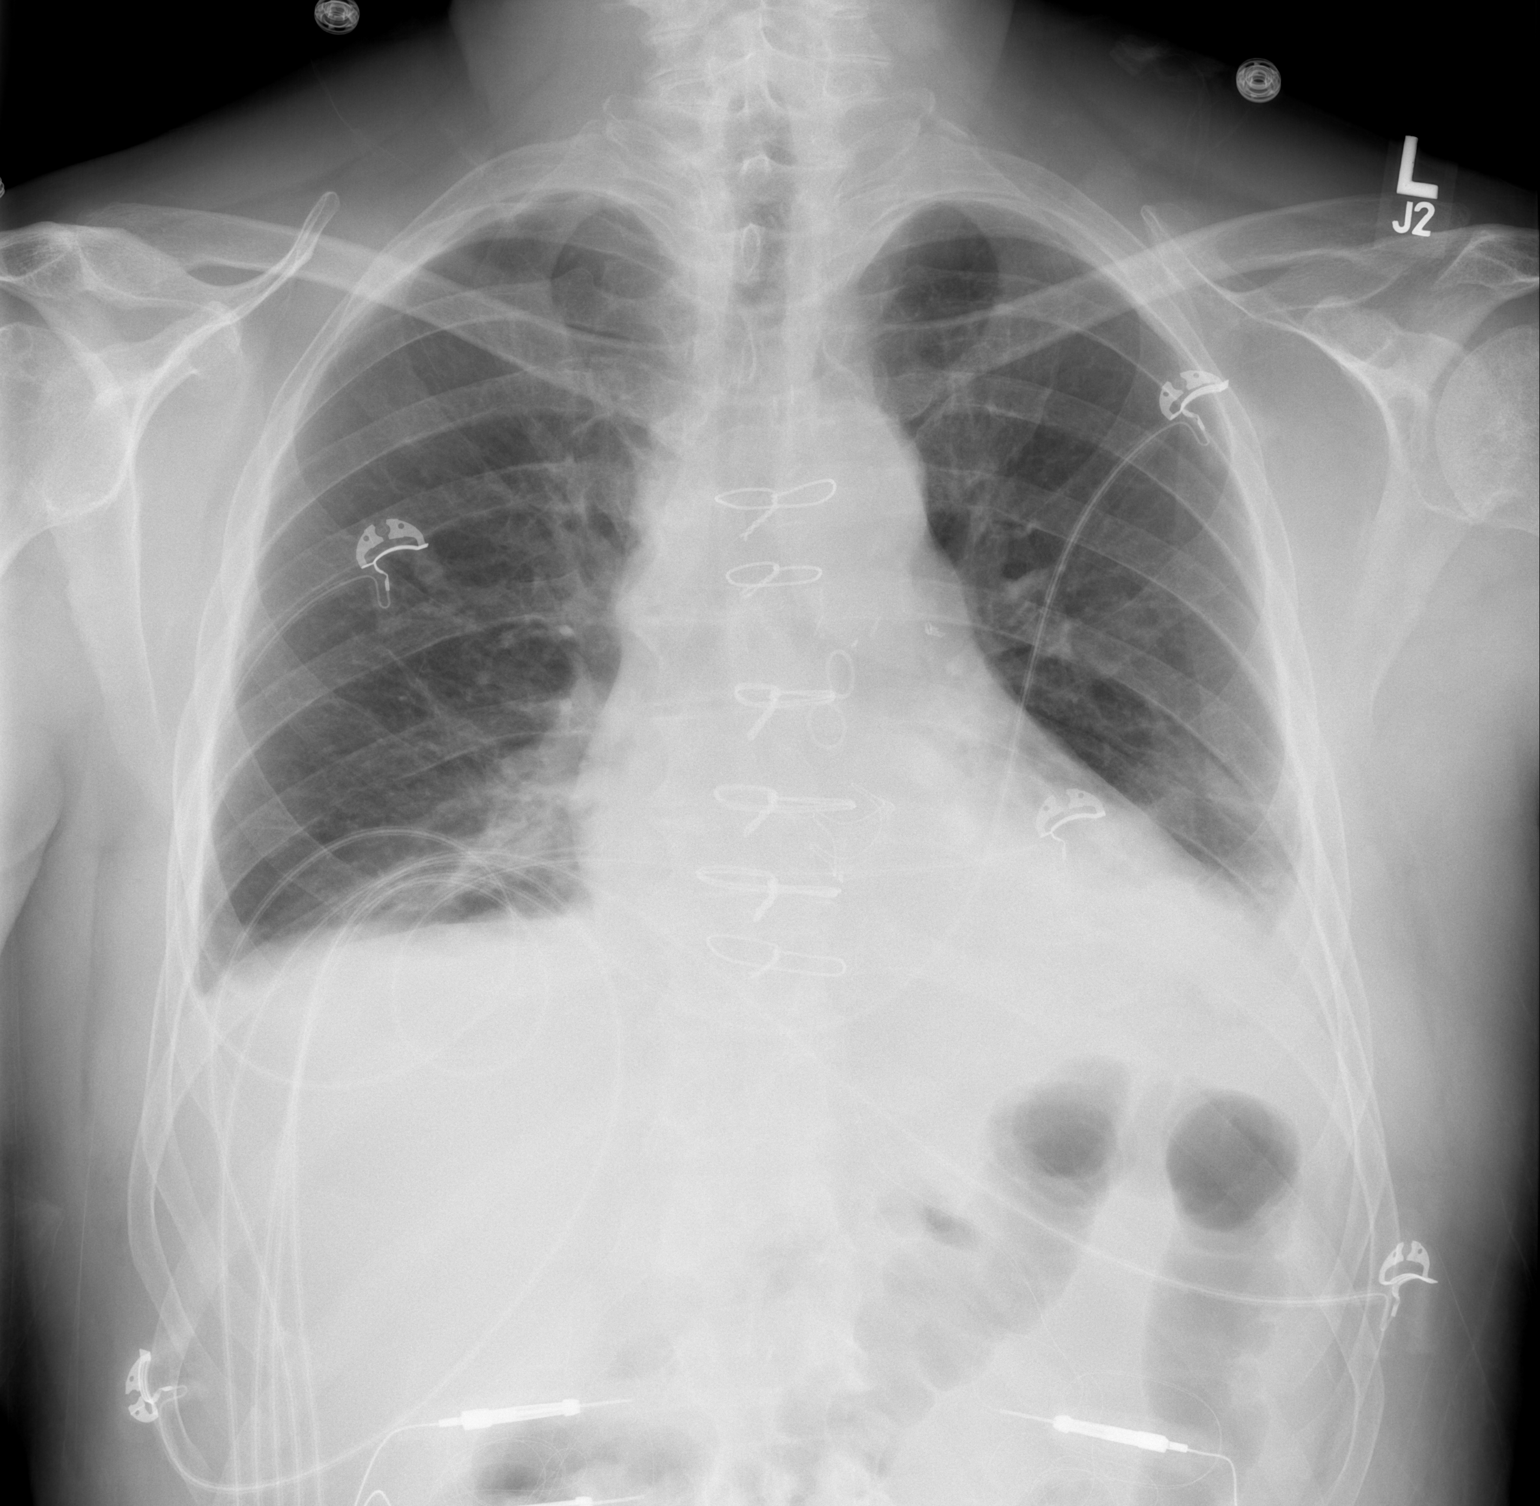

[w chest lat]
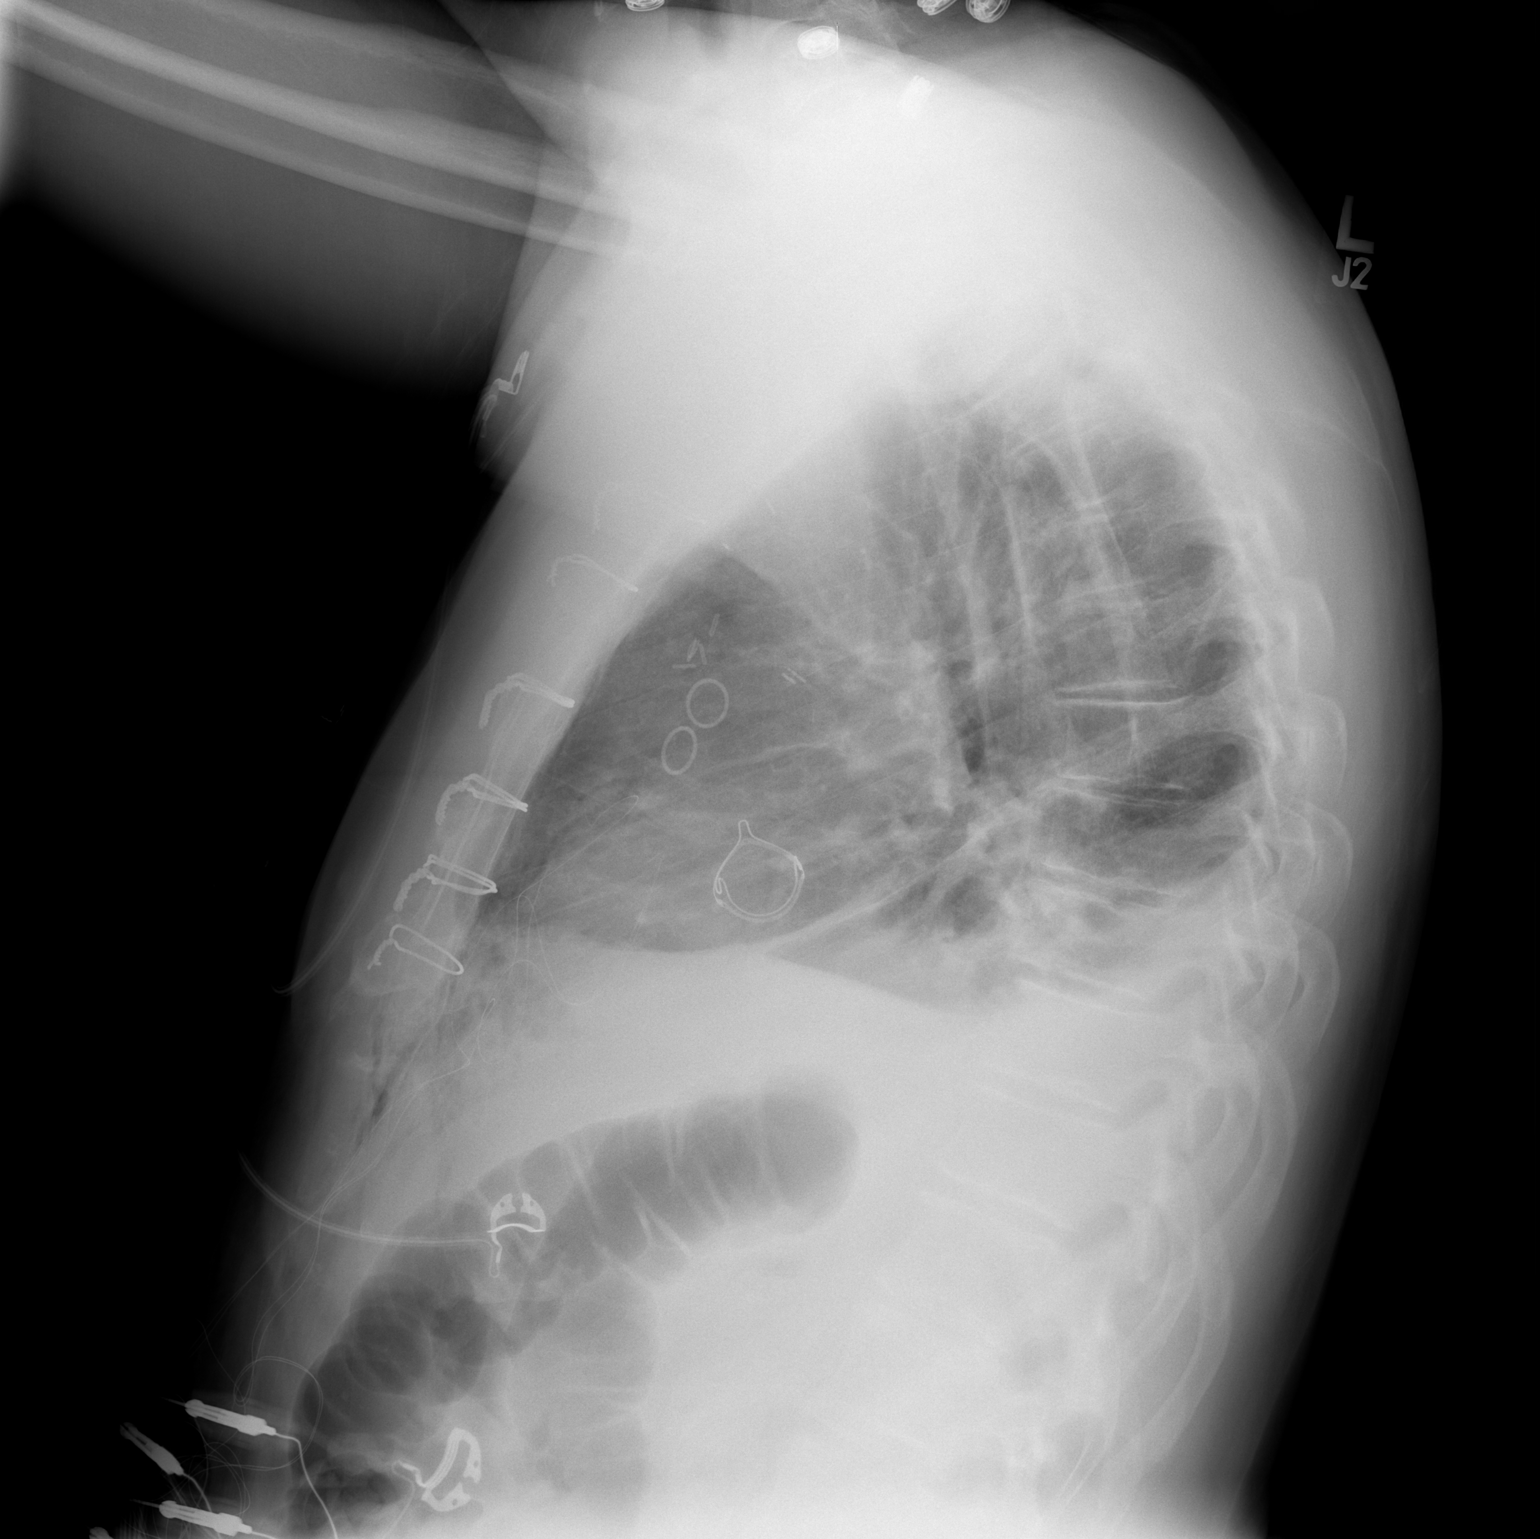

[2 of 2 positions shown; findings below may reference images not displayed]

FINDINGS: Prior median sternotomy and valve replacement.  There are
bibasilar opacities, likely atelectasis.  Small left effusion.  No
pneumothorax.  Interval removal of Swan-Ganz catheter.
IMPRESSION: Bibasilar atelectasis and small left effusion.  No pneumothorax.

## 2013-01-29 ENCOUNTER — Other Ambulatory Visit: Payer: Self-pay

## 2013-01-29 MED ORDER — CARVEDILOL 6.25 MG PO TABS: ORAL_TABLET | ORAL | Status: AC

## 2013-02-05 IMAGING — CR DG CHEST 1V PORT
1 series · 1 of 1 positions shown · non-contrast
Comparison: 05/31/2010.

CLINICAL DATA: History of removal of chest tubes.

PORTABLE CHEST - 1 VIEW

[AP]
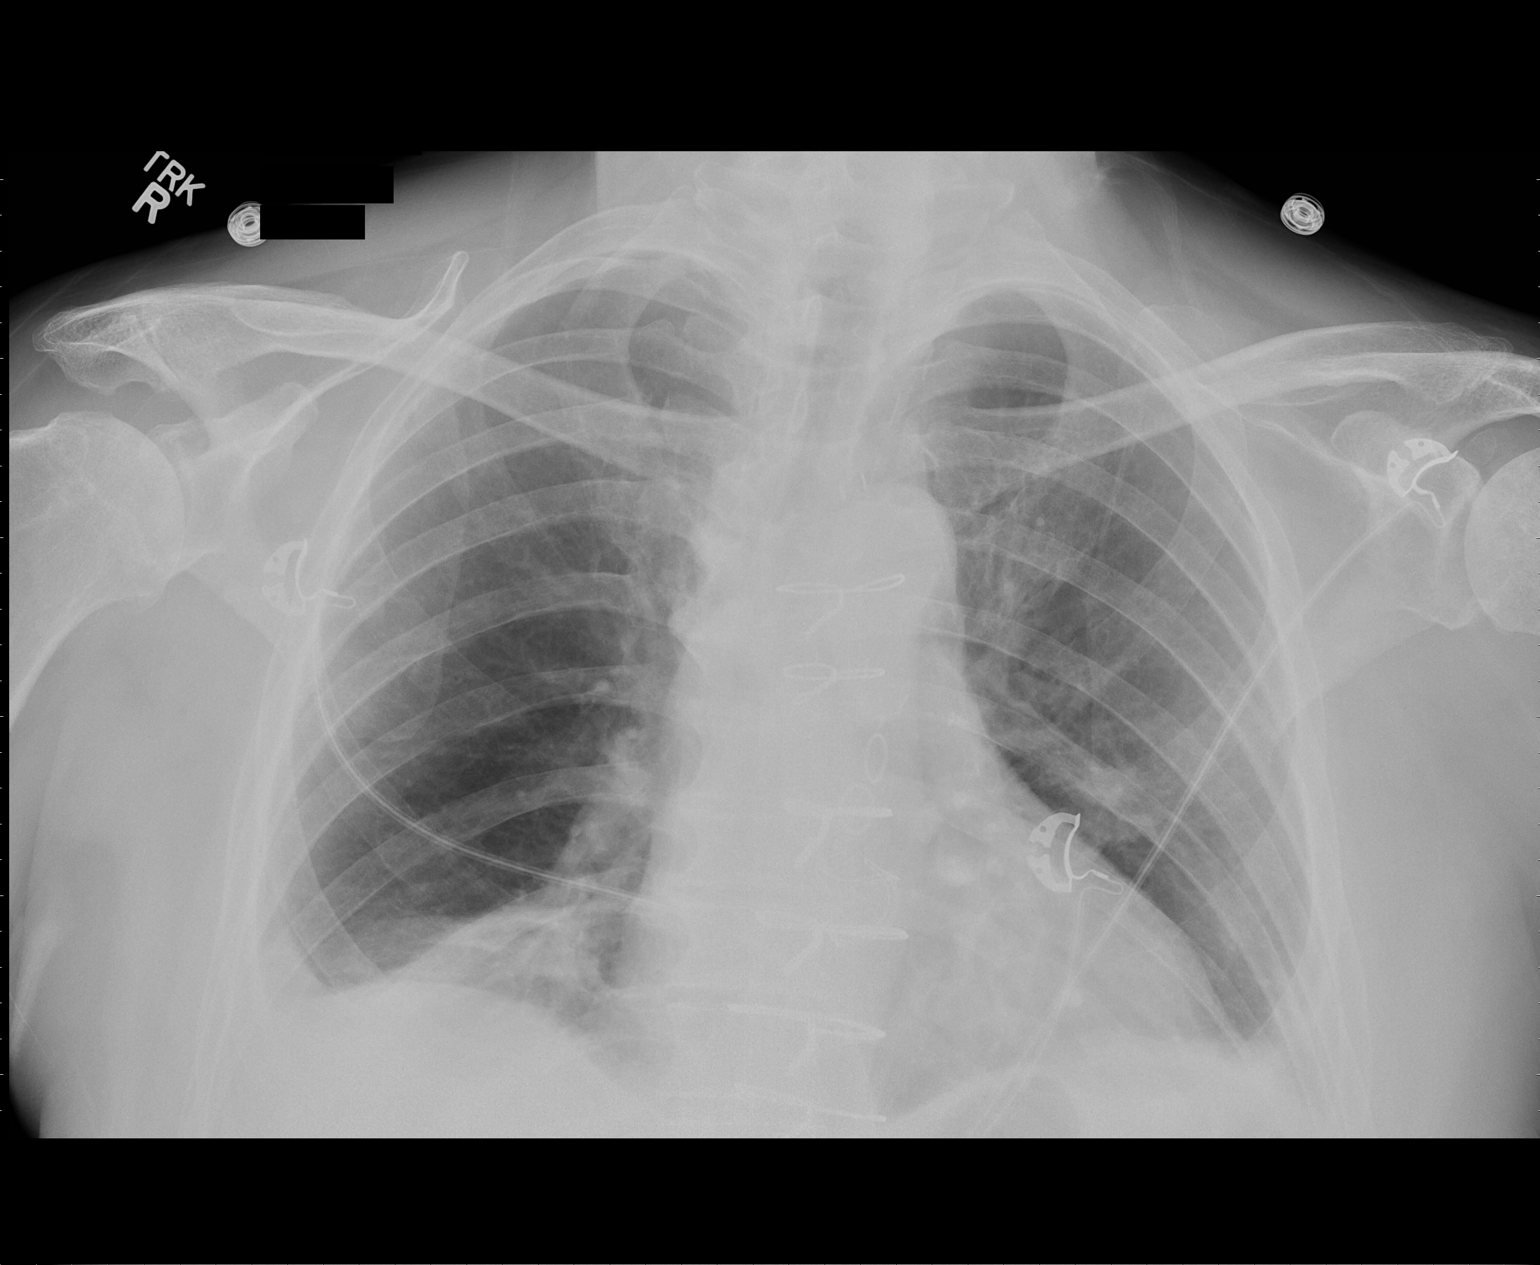

[1 of 1 positions shown; findings below may reference images not displayed]

FINDINGS: Interval removal of bilateral chest tubes.  No
pneumothorax.  Elevation of right hemidiaphragm with atelectasis
and infiltrate in right lung base.  Indistinctness of costophrenic
angles may reflect pleural thickening or tiny amounts of pleural
effusion. The cardiac silhouette is normal size and shape.
Osteophytes are present in the spine. The patient has undergone
previous median sternotomy and coronary artery bypass grafting.
IMPRESSION: Interval removal of bilateral chest tubes.  No pneumothorax.
Elevation of right hemidiaphragm with atelectasis and infiltrate in
right lung base.  Indistinctness of costophrenic angles may reflect
pleural thickening or tiny amounts of pleural effusion.

## 2013-06-07 ENCOUNTER — Encounter: Payer: Self-pay | Admitting: Cardiology

## 2013-10-10 ENCOUNTER — Telehealth: Payer: Self-pay | Admitting: Cardiology

## 2013-10-10 NOTE — Telephone Encounter (Signed)
[facility] @ [phone]   7.30.15/km

## 2019-11-15 ENCOUNTER — Encounter: Payer: Self-pay | Admitting: Gastroenterology
